# Patient Record
Sex: Female | Born: 1995 | Race: Asian
Health system: Southern US, Academic
[De-identification: ages and names within clinical notes are randomized; demographics above are authoritative.]

## PROBLEM LIST (undated history)

## (undated) ENCOUNTER — Encounter

## (undated) ENCOUNTER — Other Ambulatory Visit

## (undated) ENCOUNTER — Inpatient Hospital Stay

## (undated) DIAGNOSIS — Z789 Other specified health status: Secondary | ICD-10-CM

## (undated) DIAGNOSIS — D573 Sickle-cell trait: Secondary | ICD-10-CM

## (undated) HISTORY — PX: NO PAST SURGERIES: SHX2092

## (undated) HISTORY — PX: WISDOM TOOTH EXTRACTION: SHX21

---

## 2017-01-17 ENCOUNTER — Inpatient Hospital Stay: Admit: 2017-01-17 | Discharge: 2017-01-18

## 2017-01-17 DIAGNOSIS — Z538 Procedure and treatment not carried out for other reasons: Principal | ICD-10-CM
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
## 2017-01-18 NOTE — ED Notes
Pt is Not Answering...

## 2017-07-01 ENCOUNTER — Inpatient Hospital Stay: Admit: 2017-07-01 | Discharge: 2017-07-01

## 2017-07-01 ENCOUNTER — Emergency Department: Admit: 2017-07-01 | Discharge: 2017-07-01

## 2017-07-01 DIAGNOSIS — S0990XA Unspecified injury of head, initial encounter: Secondary | ICD-10-CM

## 2017-07-01 DIAGNOSIS — Z91018 Allergy to other foods: Secondary | ICD-10-CM

## 2017-07-01 DIAGNOSIS — H539 Unspecified visual disturbance: Secondary | ICD-10-CM

## 2017-07-01 DIAGNOSIS — F0781 Postconcussional syndrome: Principal | ICD-10-CM

## 2017-07-01 DIAGNOSIS — M79642 Pain in left hand: Secondary | ICD-10-CM

## 2017-07-01 DIAGNOSIS — Z833 Family history of diabetes mellitus: Secondary | ICD-10-CM

## 2017-07-01 DIAGNOSIS — S8012XA Contusion of left lower leg, initial encounter: Secondary | ICD-10-CM

## 2017-07-01 DIAGNOSIS — M549 Dorsalgia, unspecified: Secondary | ICD-10-CM

## 2017-07-01 DIAGNOSIS — R42 Dizziness and giddiness: Secondary | ICD-10-CM

## 2017-07-01 DIAGNOSIS — G44309 Post-traumatic headache, unspecified, not intractable: Secondary | ICD-10-CM

## 2017-07-01 MED ORDER — IBUPROFEN 800 MG PO TABS
800 mg | Freq: Once | ORAL | Status: CP
Start: 2017-07-01 — End: ?

## 2017-07-01 MED ORDER — METHYLDOPA 250 MG PO TABS
Freq: Three times a day (TID) | ORAL
Start: 2017-07-01 — End: 2017-07-01

## 2017-07-01 MED ORDER — ACETAMINOPHEN 325 MG PO TABS
650 mg | Freq: Once | ORAL | Status: CP
Start: 2017-07-01 — End: ?

## 2017-07-01 NOTE — ED Attestation Note
Attestation   I discussed this patient with the ARNP/PA.              Bennie Pieriniios, Luis E, MD  07/01/17 [phone]
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
## 2017-07-01 NOTE — ED Notes
Time of discharge: 1639  PM., Patient discharged to  Home.  Patient discharged  ambulatory. to exit with belongings in  Stable condition.  Patient escorted by  family., Written discharge instructions, education and follow up care given to  patient.  Patient/recipient  verbalizes discharge instructions.

## 2017-07-04 ENCOUNTER — Inpatient Hospital Stay: Admit: 2017-07-04 | Discharge: 2017-07-04

## 2017-07-04 DIAGNOSIS — Z91018 Allergy to other foods: Secondary | ICD-10-CM

## 2017-07-04 DIAGNOSIS — Z202 Contact with and (suspected) exposure to infections with a predominantly sexual mode of transmission: Principal | ICD-10-CM

## 2017-07-04 DIAGNOSIS — Z833 Family history of diabetes mellitus: Secondary | ICD-10-CM

## 2017-07-04 MED ORDER — AZITHROMYCIN 250 MG PO TABS
1000 mg | Freq: Once | ORAL | Status: CP
Start: 2017-07-04 — End: ?

## 2017-07-04 MED ORDER — CEFTRIAXONE SODIUM 250 MG IJ SOLR
250 mg | Freq: Once | INTRAMUSCULAR | Status: CP
Start: 2017-07-04 — End: ?
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
## 2017-07-04 NOTE — ED Provider Notes
EKG (Read by ED Provider):  {EKG findings:[phone]}        ED Course & Re-Evaluation          MDM   Decide to obtain history from someone other than the patient: {SH ED Lamonte SakaiJX MDM - OBTAIN ZOXWRUE:45409}HISTORY:28378}    Decide to obtain previous medical records: [facility]{SH ED Lamonte SakaiJX MDM - PREVIOUS MED REC - NO WJX:91478}YES:28380}    Clinical Lab Test(s): {SH ED Lamonte SakaiJX MDM ORDERED AND REVIEWED:28124}    Diagnostic Tests (Radiology, EKG): {SH ED Lamonte SakaiJX MDM ORDERED AND REVIEWED:28124}    Independent Visualization (ED US, Wet Prep, Other): {SH ED Lamonte SakaiJX MDM NO YES GNFAOZHY:86578}WILDCARD:26444}    Discussed patient with NON-ED Provider: {SH ED Lamonte SakaiJX MDM - ANOTHER PROVIDER:28381}      ED Disposition   ED Disposition: No ED Disposition Set      ED Clinical Impression   ED Clinical Impression:   No Clinical Impression Set      ED Patient Status   Patient Status:   [facility][phone]}        ED Medical Evaluation Initiated   Medical Evaluation Initiated:  Yes, filed at 07/01/17 1102  by Kennis CarinaArmstrong, Jamie L, PA-C
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
## 2017-07-04 NOTE — ED Provider Notes
?   Number of children: N/A   ? Years of education: N/A     Social History Main Topics   ? Smoking status: Never Smoker   ? Smokeless tobacco: Never Used   ? Alcohol use No   ? Drug use: No   ? Sexual activity: Not Asked     Other Topics Concern   ? None     Social History Narrative   ? None       Review of Systems   Constitutional: Negative for fever, chills, diaphoresis, appetite change, decreased responsiveness and fatigue.   HENT: Positive for facial swelling. Negative for nosebleeds, nasal congestion, drooling, trouble swallowing, neck pain, neck stiffness, voice change and ear discharge.    Eyes: Positive for visual disturbance.   Respiratory: Negative for cough, chest tightness, shortness of breath, wheezing and stridor.    Cardiovascular: Negative for chest pain, palpitations and leg swelling.   Gastrointestinal: Negative for nausea, vomiting, abdominal pain and bowel incontinence.   Genitourinary: Negative for bladder incontinence.   Musculoskeletal: Positive for back pain and arthralgias.   Skin: Positive for color change and wound.   Neurological: Positive for dizziness, light-headedness and headaches. Negative for tingling, focal weakness, seizures, loss of consciousness, syncope, facial asymmetry, speech difficulty, weakness and numbness.   Psychiatric/Behavioral: Negative for memory loss.   All other systems reviewed and are negative.      Physical Exam     ED Triage Vitals [07/01/17 0958]   BP 118/54   Pulse 104   Resp 18   Temp 36.9 ?C (98.5 ?F)   Temp src Oral   Height 1.651 m   Weight 78.9 kg   SpO2 99 %   BMI (Calculated) 29.02             Physical Exam   Constitutional: She is oriented to person, place, and time. She appears well-developed and well-nourished. No distress.   HENT:   Head: Normocephalic and atraumatic.   Neck: Normal range of motion. Neck supple.   Cardiovascular: Normal rate.    Pulmonary/Chest: Effort normal and breath sounds normal. No stridor. No
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
## 2017-07-04 NOTE — ED Notes
Time of discharge: 1855  PM., Patient discharged to  Home.  Patient discharged  ambulatory. to exit with belongings in  Stable condition.  Patient escorted by  no one., Written discharge instructions given to  patient.  Patient/recipient  verbalizes discharge instructions.

## 2017-07-04 NOTE — ED Provider Notes
History reviewed. No pertinent past medical history.    History reviewed. No pertinent surgical history.    Family History   Problem Relation Age of Onset   ? Diabetes Paternal Grandmother    ? Heart Failure Paternal Grandmother        Social History     Social History   ? Marital status: Single     Spouse name: N/A   ? Number of children: N/A   ? Years of education: N/A     Social History Main Topics   ? Smoking status: Never Smoker   ? Smokeless tobacco: Never Used   ? Alcohol use No   ? Drug use: No   ? Sexual activity: Not Asked     Other Topics Concern   ? None     Social History Narrative   ? None       Review of Systems   Constitutional: Negative for fever, chills, diaphoresis, appetite change, decreased responsiveness and fatigue.   HENT: Positive for facial swelling. Negative for nosebleeds, nasal congestion, drooling, trouble swallowing, neck pain, neck stiffness, voice change and ear discharge.    Eyes: Positive for visual disturbance.   Respiratory: Negative for cough, chest tightness, shortness of breath, wheezing and stridor.    Cardiovascular: Negative for chest pain, palpitations and leg swelling.   Gastrointestinal: Negative for nausea, vomiting, abdominal pain and bowel incontinence.   Genitourinary: Negative for bladder incontinence.   Musculoskeletal: Positive for back pain and arthralgias.   Skin: Positive for color change and wound.   Neurological: Positive for dizziness, light-headedness and headaches. Negative for tingling, focal weakness, seizures, loss of consciousness, syncope, facial asymmetry, speech difficulty, weakness and numbness.   Psychiatric/Behavioral: Negative for memory loss.   All other systems reviewed and are negative.      Physical Exam     ED Triage Vitals [07/01/17 0958]   BP 118/54   Pulse 104   Resp 18   Temp 36.9 ?C (98.5 ?F)   Temp src Oral   Height 1.651 m   Weight 78.9 kg   SpO2 99 %   BMI (Calculated) 29.02             Physical Exam

## 2017-07-04 NOTE — ED Provider Notes
No medications on file   Modified Medications    No medications on file   Discontinued Medications    METHYLDOPA (ALDOMET) 250 MG PO TABLET    Take by mouth 3 times daily.       History reviewed. No pertinent past medical history.    History reviewed. No pertinent surgical history.    Family History   Problem Relation Age of Onset   ? Diabetes Paternal Grandmother    ? Heart Failure Paternal Grandmother        Social History     Social History   ? Marital status: Single     Spouse name: N/A   ? Number of children: N/A   ? Years of education: N/A     Social History Main Topics   ? Smoking status: Never Smoker   ? Smokeless tobacco: Never Used   ? Alcohol use No   ? Drug use: No   ? Sexual activity: Not Asked     Other Topics Concern   ? None     Social History Narrative   ? None       Review of Systems   Constitutional: Negative for fever, chills, diaphoresis, appetite change, decreased responsiveness and fatigue.   HENT: Positive for facial swelling. Negative for nosebleeds, nasal congestion, drooling, trouble swallowing, neck pain, neck stiffness, voice change and ear discharge.    Eyes: Positive for visual disturbance.   Respiratory: Negative for cough, chest tightness, shortness of breath, wheezing and stridor.    Cardiovascular: Negative for chest pain, palpitations and leg swelling.   Gastrointestinal: Negative for nausea, vomiting, abdominal pain and bowel incontinence.   Genitourinary: Negative for bladder incontinence.   Musculoskeletal: Positive for back pain and arthralgias.   Skin: Positive for color change and wound.   Neurological: Positive for dizziness, light-headedness and headaches. Negative for tingling, focal weakness, seizures, loss of consciousness, syncope, facial asymmetry, speech difficulty, weakness and numbness.   Psychiatric/Behavioral: Negative for memory loss.   All other systems reviewed and are negative.      Physical Exam     ED Triage Vitals [07/01/17 0958]   BP 118/54   Pulse 104
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
## 2017-07-07 NOTE — ED Provider Notes
Gastrointestinal: Negative for nausea, vomiting, abdominal pain, diarrhea, constipation and anorexia.   Genitourinary: Negative for dysuria, frequency, hematuria, flank pain, vaginal bleeding, vaginal discharge, genital sores, vaginal pain, pelvic pain and dyspareunia.   Musculoskeletal: Negative for myalgias and arthralgias.   Skin: Negative for rash and wound.   Neurological: Negative for dizziness and light-headedness.   Psychiatric/Behavioral: Negative for substance abuse.       Physical Exam     ED Triage Vitals [07/04/17 1357]   BP 107/53   Pulse 73   Resp 14   Temp 36.5 ?C (97.7 ?F)   Temp src Oral   Height 1.651 m   Weight 72.6 kg   SpO2 99 %   BMI (Calculated) 26.68             Physical Exam   Constitutional: She is oriented to person, place, and time. She appears well-developed and well-nourished. No distress.   HENT:   Head: Normocephalic and atraumatic.   Mouth/Throat: Oropharynx is clear and moist.   Eyes: Pupils are equal, round, and reactive to light. Conjunctivae are normal.   Cardiovascular: Normal rate, regular rhythm, normal heart sounds and intact distal pulses.    Pulmonary/Chest: Effort normal and breath sounds normal.   Abdominal: Soft. There is no tenderness. Hernia confirmed negative in the right inguinal area and confirmed negative in the left inguinal area.   Genitourinary: There is no rash, tenderness or lesion on the right labia. There is no rash, tenderness or lesion on the left labia. Uterus is not tender. Cervix exhibits no discharge. Right adnexum displays no tenderness. Left adnexum displays no tenderness. No erythema, tenderness or bleeding in the vagina. No foreign body in the vagina. No signs of injury around the vagina. No vaginal discharge found.   Genitourinary Comments: RN Laural BenesJohnson present throughout exam     Musculoskeletal: She exhibits no edema or tenderness.   Extremies well perfused   Lymphadenopathy:        Right: No inguinal adenopathy present.

## 2017-07-07 NOTE — ED Provider Notes
Clarity, UA Clear      Spec Grav 1.005  1.003 - 1.030    pH 6.0 (*) 7.4 - 7.4    Urobilinogen -Ur 0.2  <=2.0 E.U./dL    Nitrite -Ur Negative  Negative    Protein-UA Negative  Negative mg/dL    Glucose -Ur Negative  Negative mg/dL    Blood, UA Negative  Negative    WBC, UA Trace (*) Negative    Bilirubin -Ur Negative  Negative    Ketones -UR Negative  Negative   POCT URINE PREGNANCY - Normal    Preg Test, Urine (POC) Negative  Negative   CHLAMYDIA/ GONORRHEA DETECTIO*   POCT URINE PREGNANCY   POCT URINALYSIS AUTO W/O MICROSCOPY         Imaging (Read by ED Provider):  not applicable      EKG (Read by ED Provider):  not applicable        ED Course & Re-Evaluation      ***    MDM   Decide to obtain history from someone other than the patient: {SH ED Lamonte SakaiJX MDM - OBTAIN ZOXWRUE:45409}HISTORY:28378}    Decide to obtain previous medical records: [facility]{SH ED Lamonte SakaiJX MDM - PREVIOUS MED REC - NO WJX:91478}YES:28380}    Clinical Lab Test(s): {SH ED Lamonte SakaiJX MDM ORDERED AND REVIEWED:28124}    Diagnostic Tests (Radiology, EKG): {SH ED Lamonte SakaiJX MDM ORDERED AND REVIEWED:28124}    Independent Visualization (ED US, Wet Prep, Other): {SH ED Lamonte SakaiJX MDM NO YES GNFAOZHY:86578}WILDCARD:26444}    Discussed patient with NON-ED Provider: {SH ED Lamonte SakaiJX MDM - ANOTHER PROVIDER:28381}      ED Disposition   ED Disposition: No ED Disposition Set      ED Clinical Impression   ED Clinical Impression:   No Clinical Impression Set      ED Patient Status   Patient Status:   [phone]}        ED Medical Evaluation Initiated   Medical Evaluation Initiated:  Yes, filed at 07/04/17 1704  by Dishong, Harlon DittyWilliam Patrick, MD            Dishong, Harlon DittyWilliam Patrick, MD  07/04/17 1704       Dishong, Harlon DittyWilliam Patrick, MD  07/04/17 1713       Dishong, Harlon DittyWilliam Patrick, MD  07/04/17 1829

## 2017-07-07 NOTE — ED Provider Notes
Clarity, UA Clear      Spec Grav 1.005  1.003 - 1.030    pH 6.0 (*) 7.4 - 7.4    Urobilinogen -Ur 0.2  <=2.0 E.U./dL    Nitrite -Ur Negative  Negative    Protein-UA Negative  Negative mg/dL    Glucose -Ur Negative  Negative mg/dL    Blood, UA Negative  Negative    WBC, UA Trace (*) Negative    Bilirubin -Ur Negative  Negative    Ketones -UR Negative  Negative   POCT URINE PREGNANCY - Normal    Preg Test, Urine (POC) Negative  Negative   CHLAMYDIA/ GONORRHEA DETECTIO*   POCT URINE PREGNANCY   POCT URINALYSIS AUTO W/O MICROSCOPY         Imaging (Read by ED Provider):  not applicable      EKG (Read by ED Provider):  not applicable        ED Course & Re-Evaluation      ***    MDM   Decide to obtain history from someone other than the patient: {SH ED Lamonte SakaiJX MDM - OBTAIN ZOXWRUE:45409}HISTORY:28378}    Decide to obtain previous medical records: [facility]{SH ED Lamonte SakaiJX MDM - PREVIOUS MED REC - NO WJX:91478}YES:28380}    Clinical Lab Test(s): {SH ED Lamonte SakaiJX MDM ORDERED AND REVIEWED:28124}    Diagnostic Tests (Radiology, EKG): {SH ED Lamonte SakaiJX MDM ORDERED AND REVIEWED:28124}    Independent Visualization (ED US, Wet Prep, Other): {SH ED Lamonte SakaiJX MDM NO YES GNFAOZHY:86578}WILDCARD:26444}    Discussed patient with NON-ED Provider: {SH ED Lamonte SakaiJX MDM - ANOTHER PROVIDER:28381}      ED Disposition   ED Disposition: No ED Disposition Set      ED Clinical Impression   ED Clinical Impression:   No Clinical Impression Set      ED Patient Status   Patient Status:   {[phone]}        ED Medical Evaluation Initiated   Medical Evaluation Initiated:  Yes, filed at 07/04/17 1704  by Dishong, Harlon DittyWilliam Patrick, MD            Dishong, Harlon DittyWilliam Patrick, MD  07/04/17 1704       Dishong, Harlon DittyWilliam Patrick, MD  07/04/17 1713

## 2017-07-07 NOTE — ED Provider Notes
{  Imaging findings:[phone]}      EKG (Read by ED Provider):  {EKG [phone]}        ED Course & Re-Evaluation          MDM   Decide to obtain history from someone other than the patient: {SH ED Lamonte SakaiJX MDM - OBTAIN ZOXWRUE:45409}HISTORY:28378}    Decide to obtain previous medical records: [facility]{SH ED Lamonte SakaiJX MDM - PREVIOUS MED REC - NO WJX:91478}YES:28380}    Clinical Lab Test(s): {SH ED Lamonte SakaiJX MDM ORDERED AND REVIEWED:28124}    Diagnostic Tests (Radiology, EKG): {SH ED Lamonte SakaiJX MDM ORDERED AND REVIEWED:28124}    Independent Visualization (ED US, Wet Prep, Other): {SH ED Lamonte SakaiJX MDM NO YES GNFAOZHY:86578}WILDCARD:26444}    Discussed patient with NON-ED Provider: {SH ED Lamonte SakaiJX MDM - ANOTHER PROVIDER:28381}      ED Disposition   ED Disposition: No ED Disposition Set      ED Clinical Impression   ED Clinical Impression:   No Clinical Impression Set      ED Patient Status   Patient Status:   [facility]:[phone]}        ED Medical Evaluation Initiated   Medical Evaluation Initiated:  Yes, filed at 07/04/17 1704  by Dishong, Harlon DittyWilliam Patrick, MD            Dishong, Harlon DittyWilliam Patrick, MD  07/04/17 1704

## 2017-07-07 NOTE — ED Provider Notes
Gastrointestinal: Negative for nausea, vomiting, abdominal pain, diarrhea, constipation and anorexia.   Genitourinary: Negative for dysuria, frequency, hematuria, flank pain, vaginal bleeding, vaginal discharge, genital sores, vaginal pain, pelvic pain and dyspareunia.   Musculoskeletal: Negative for myalgias and arthralgias.   Skin: Negative for rash and wound.   Neurological: Negative for dizziness and light-headedness.   Psychiatric/Behavioral: Negative for substance abuse.       Physical Exam     ED Triage Vitals [07/04/17 1357]   BP 107/53   Pulse 73   Resp 14   Temp 36.5 ?C (97.7 ?F)   Temp src Oral   Height 1.651 m   Weight 72.6 kg   SpO2 99 %   BMI (Calculated) 26.68             Physical Exam   Constitutional: She is oriented to person, place, and time. She appears well-developed and well-nourished. No distress.   HENT:   Head: Normocephalic and atraumatic.   Mouth/Throat: Oropharynx is clear and moist.   Eyes: Pupils are equal, round, and reactive to light. Conjunctivae are normal.   Cardiovascular: Normal rate, regular rhythm, normal heart sounds and intact distal pulses.    Pulmonary/Chest: Effort normal and breath sounds normal.   Abdominal: Soft. There is no tenderness.   Genitourinary:   Genitourinary Comments: RN *** present throughout exam   Musculoskeletal: She exhibits no edema or tenderness.   Extremies well perfused   Neurological: She is alert and oriented to person, place, and time.   Skin: Skin is warm and dry. No rash noted. She is not diaphoretic.   Psychiatric: She has a normal mood and affect.   Linear thought process   Nursing note and vitals reviewed.      Differential DDx: pregnancy, UTI, STI/exposure to STI    Is this an Emergent Medical Condition? Yes - Severe Pain/Acute Onset of Symptons  409.901 FS  641.19 FS  627.732 (16) FS    ED Workup   Procedures    Labs:  -   POCT URINALYSIS AUTO W/O MICROSCOPY - Abnormal        Result Value Ref Range    Color -Ur Yellow

## 2017-07-07 NOTE — ED Provider Notes
History     Chief Complaint   Patient presents with   ? STD Female       HPI    Allergies   Allergen Reactions   ? Orange Fruit [Citrus] Rash       Patient's Medications    No medications on file       History reviewed. No pertinent past medical history.    History reviewed. No pertinent surgical history.    Family History   Problem Relation Age of Onset   ? Diabetes Paternal Grandmother    ? Heart Failure Paternal Grandmother        Social History     Social History   ? Marital status: Single     Spouse name: N/A   ? Number of children: N/A   ? Years of education: N/A     Social History Main Topics   ? Smoking status: Never Smoker   ? Smokeless tobacco: Never Used   ? Alcohol use No   ? Drug use: No   ? Sexual activity: Not Asked     Other Topics Concern   ? None     Social History Narrative   ? None       Review of Systems    Physical Exam     ED Triage Vitals [07/04/17 1357]   BP 107/53   Pulse 73   Resp 14   Temp 36.5 ?C (97.7 ?F)   Temp src Oral   Height 1.651 m   Weight 72.6 kg   SpO2 99 %   BMI (Calculated) 26.68             Physical Exam    Differential DDx: ***    Is this an Emergent Medical Condition? {SH ED EMERGENT MEDICAL CONDITION:[phone]}  409.901 FS  641.19 FS  627.732 (16) FS    ED Workup   Procedures    Labs:  -   POCT URINALYSIS AUTO W/O MICROSCOPY - Abnormal        Result Value Ref Range    Color -Ur Yellow      Clarity, UA Clear      Spec Grav 1.005  1.003 - 1.030    pH 6.0 (*) 7.4 - 7.4    Urobilinogen -Ur 0.2  <=2.0 E.U./dL    Nitrite -Ur Negative  Negative    Protein-UA Negative  Negative mg/dL    Glucose -Ur Negative  Negative mg/dL    Blood, UA Negative  Negative    WBC, UA Trace (*) Negative    Bilirubin -Ur Negative  Negative    Ketones -UR Negative  Negative   POCT URINE PREGNANCY - Normal    Preg Test, Urine (POC) Negative  Negative   CHLAMYDIA/ GONORRHEA DETECTIO*   POCT URINE PREGNANCY   POCT URINALYSIS AUTO W/O MICROSCOPY         Imaging (Read by ED Provider):

## 2017-07-07 NOTE — ED Provider Notes
History     Chief Complaint   Patient presents with   ? STD Female       21 yo female who presents to the ED with concern for STI exposure.  Pt reports her significant tested positive for chlamydia.  Pt denies prior STI.  Pt denies vaginal discharge, dysuria, rash, bleeding, abdominal/pelvic/back pain. No f/c. No hematuria.      The history is provided by the patient.   STD Female   Primary symptoms include no discharge, no pelvic pain, no dyspareunia, no genital lesions, no genital pain, no genital rash, no genital itching, no genital odor, no dysuria, and no vaginal bleeding. Primary symptoms comment: Exposure to STI. There has been no fever. This is a new problem. She is not pregnant. She has not missed her period. Pertinent negatives include no anorexia, no diaphoresis, no abdominal swelling, no abdominal pain, no constipation, no diarrhea, no nausea, no vomiting, no frequency, no light-headedness and no dizziness. She has tried nothing for the symptoms.       Allergies   Allergen Reactions   ? Orange Fruit [Citrus] Rash       Patient's Medications    No medications on file       History reviewed. No pertinent past medical history.    History reviewed. No pertinent surgical history.    Family History   Problem Relation Age of Onset   ? Diabetes Paternal Grandmother    ? Heart Failure Paternal Grandmother        Social History     Social History   ? Marital status: Single     Spouse name: N/A   ? Number of children: N/A   ? Years of education: N/A     Social History Main Topics   ? Smoking status: Never Smoker   ? Smokeless tobacco: Never Used   ? Alcohol use No   ? Drug use: No   ? Sexual activity: Not Asked     Other Topics Concern   ? None     Social History Narrative   ? None       Review of Systems   Constitutional: Negative for fever, chills, diaphoresis and fatigue.   HENT: Negative for sore throat.    Eyes: Negative for discharge.   Respiratory: Negative for cough.

## 2017-07-07 NOTE — ED Provider Notes
Left: No inguinal adenopathy present.   Neurological: She is alert and oriented to person, place, and time.   Skin: Skin is warm and dry. No rash noted. She is not diaphoretic.   Psychiatric: She has a normal mood and affect.   Linear thought process   Nursing note and vitals reviewed.      Differential DDx: pregnancy, UTI, STI/exposure to STI    Is this an Emergent Medical Condition? Yes - Severe Pain/Acute Onset of Symptons  409.901 FS  641.19 FS  627.732 (16) FS    ED Workup   Procedures    Labs:  -   POCT URINALYSIS AUTO W/O MICROSCOPY - Abnormal        Result Value Ref Range    Color -Ur Yellow      Clarity, UA Clear      Spec Grav 1.005  1.003 - 1.030    pH 6.0 (*) 7.4 - 7.4    Urobilinogen -Ur 0.2  <=2.0 E.U./dL    Nitrite -Ur Negative  Negative    Protein-UA Negative  Negative mg/dL    Glucose -Ur Negative  Negative mg/dL    Blood, UA Negative  Negative    WBC, UA Trace (*) Negative    Bilirubin -Ur Negative  Negative    Ketones -UR Negative  Negative   POCT URINE PREGNANCY - Normal    Preg Test, Urine (POC) Negative  Negative   CHLAMYDIA/ GONORRHEA DETECTIO*   POCT URINE PREGNANCY   POCT URINALYSIS AUTO W/O MICROSCOPY         Imaging (Read by ED Provider):  not applicable      EKG (Read by ED Provider):  not applicable        ED Course & Re-Evaluation      STI exposure.   Non-toxic. No evidence of PID.  Not pregnant. Wet prep negative. Will give STI PPx.   Answered questions to the best of my ability.  Pt aware of precautions for return.  Pt comfortable with plan.      MDM   Decide to obtain history from someone other than the patient: No    Decide to obtain previous medical records: Yes - The review of the records selected below is documented in the ED Note : ( Prior ED visit )    Clinical Lab Test(s): Ordered and Reviewed    Diagnostic Tests (Radiology, EKG): N/A    Independent Visualization (ED US, Wet Prep, Other): Yes - Documented in ED Provider Note

## 2017-07-07 NOTE — ED Provider Notes
Discussed patient with NON-ED Provider: None      ED Disposition   ED Disposition: No ED Disposition Set      ED Clinical Impression   ED Clinical Impression:   No Clinical Impression Set      ED Patient Status   Patient Status:   Good        ED Medical Evaluation Initiated   Medical Evaluation Initiated:  Yes, filed at 07/04/17 1704  by Dishong, Harlon DittyWilliam Patrick, MD            Dishong, Harlon DittyWilliam Patrick, MD  07/04/17 1704       Dishong, Harlon DittyWilliam Patrick, MD  07/04/17 1713       Dishong, Harlon DittyWilliam Patrick, MD  07/04/17 1829       Dishong, Harlon DittyWilliam Patrick, MD  07/06/17 2025

## 2018-05-14 ENCOUNTER — Other Ambulatory Visit: Payer: Self-pay

## 2018-05-14 ENCOUNTER — Emergency Department (HOSPITAL_COMMUNITY): Payer: Self-pay

## 2018-05-14 ENCOUNTER — Encounter (HOSPITAL_COMMUNITY): Payer: Self-pay | Admitting: Emergency Medicine

## 2018-05-14 ENCOUNTER — Emergency Department (HOSPITAL_COMMUNITY)
Admission: EM | Admit: 2018-05-14 | Discharge: 2018-05-14 | Disposition: A | Discharge status: Home / Self Care | Payer: Self-pay | Attending: Emergency Medicine | Admitting: Emergency Medicine

## 2018-05-14 DIAGNOSIS — O468X1 Other antepartum hemorrhage, first trimester: Secondary | ICD-10-CM | POA: Insufficient documentation

## 2018-05-14 DIAGNOSIS — O418X11 Other specified disorders of amniotic fluid and membranes, first trimester, fetus 1: Secondary | ICD-10-CM

## 2018-05-14 DIAGNOSIS — M545 Low back pain: Secondary | ICD-10-CM | POA: Insufficient documentation

## 2018-05-14 DIAGNOSIS — Z3A01 Less than 8 weeks gestation of pregnancy: Secondary | ICD-10-CM | POA: Insufficient documentation

## 2018-05-14 DIAGNOSIS — O26891 Other specified pregnancy related conditions, first trimester: Secondary | ICD-10-CM | POA: Insufficient documentation

## 2018-05-14 DIAGNOSIS — Z3491 Encounter for supervision of normal pregnancy, unspecified, first trimester: Secondary | ICD-10-CM | POA: Insufficient documentation

## 2018-05-14 DIAGNOSIS — Z79899 Other long term (current) drug therapy: Secondary | ICD-10-CM | POA: Insufficient documentation

## 2018-05-14 LAB — URINALYSIS, ROUTINE W REFLEX MICROSCOPIC
Bacteria, UA: NONE SEEN
Bilirubin Urine: NEGATIVE
GLUCOSE, UA: NEGATIVE mg/dL
Hgb urine dipstick: NEGATIVE
Ketones, ur: NEGATIVE mg/dL
NITRITE: NEGATIVE
PH: 5 (ref 5.0–8.0)
PROTEIN: NEGATIVE mg/dL
Specific Gravity, Urine: 1.014 (ref 1.005–1.030)

## 2018-05-14 LAB — CBC
HCT: 34.8 % — ABNORMAL LOW (ref 36.0–46.0)
HEMOGLOBIN: 10.9 g/dL — AB (ref 12.0–15.0)
MCH: 23.9 pg — AB (ref 26.0–34.0)
MCHC: 31.3 g/dL (ref 30.0–36.0)
MCV: 76.1 fL — ABNORMAL LOW (ref 78.0–100.0)
Platelets: 286 10*3/uL (ref 150–400)
RBC: 4.57 MIL/uL (ref 3.87–5.11)
RDW: 14.3 % (ref 11.5–15.5)
WBC: 5.1 10*3/uL (ref 4.0–10.5)

## 2018-05-14 LAB — COMPREHENSIVE METABOLIC PANEL
ALK PHOS: 30 U/L — AB (ref 38–126)
ALT: 14 U/L (ref 14–54)
ANION GAP: 7 (ref 5–15)
AST: 20 U/L (ref 15–41)
Albumin: 3.9 g/dL (ref 3.5–5.0)
BILIRUBIN TOTAL: 0.6 mg/dL (ref 0.3–1.2)
BUN: <5 mg/dL — ABNORMAL LOW (ref 6–20)
CALCIUM: 9.3 mg/dL (ref 8.9–10.3)
CO2: 24 mmol/L (ref 22–32)
CREATININE: 0.69 mg/dL (ref 0.44–1.00)
Chloride: 106 mmol/L (ref 101–111)
GFR calc non Af Amer: >60 mL/min (ref >60)
GLUCOSE: 98 mg/dL (ref 65–99)
Potassium: 3.5 mmol/L (ref 3.5–5.1)
Sodium: 137 mmol/L (ref 135–145)
TOTAL PROTEIN: 7.5 g/dL (ref 6.5–8.1)

## 2018-05-14 LAB — WET PREP, GENITAL
CLUE CELLS WET PREP: NONE SEEN
SPERM: NONE SEEN
TRICH WET PREP: NONE SEEN
YEAST WET PREP: NONE SEEN

## 2018-05-14 LAB — I-STAT BETA HCG BLOOD, ED (MC, WL, AP ONLY): I-stat hCG, quantitative: >2000 m[IU]/mL — ABNORMAL HIGH (ref <5)

## 2018-05-14 LAB — LIPASE, BLOOD: Lipase: 23 U/L (ref 11–51)

## 2018-05-14 LAB — HCG, QUANTITATIVE, PREGNANCY: HCG, BETA CHAIN, QUANT, S: 136448 m[IU]/mL — AB (ref <5)

## 2018-05-14 MED ORDER — CONCEPT OB 130-92.4-1 MG PO CAPS: 1.0000 | ORAL_CAPSULE | Freq: Every day | ORAL | 12 refills | Status: AC

## 2018-05-14 NOTE — ED Provider Notes (Signed)
[facility] EMERGENCY DEPARTMENT Provider Note   CSN: [id number] Arrival date & time: 05/14/18  4098     History   Chief Complaint Chief Complaint  Patient presents with  . Headache    HPI Melissa Gay is a 22 y.o. female who presents for cc of lower back and abdominal pain. She c/o pain in her lower back which is worse with movement, standing and twisting. She says that it is pressure like. She denies previous injuries. She feels that it is associated with mild suprapubic abdominal pain which she feels also began 3 weeks ago. She denies vaginal or urinary sxs. She denies constipation, nausea, or vomiting. She has had intermittent temporal headaches that she describes as fleeting, stabbing, and throbbing. Denies photophobia, phonophobia, UL throbbing, N/V, visual changes, stiff neck, neck pain, rash, or "thunderclap" onset.    HPI  History reviewed. No pertinent past medical history.  There are no active problems to display for this patient.   History reviewed. No pertinent surgical history.   OB History   None      Home Medications    Prior to Admission medications   Medication Sig Start Date End Date Taking? Authorizing Provider  Prenat w/o A Vit-FeFum-FePo-FA (CONCEPT OB) 130-92.4-1 MG CAPS Take 1 capsule by mouth daily. 05/14/18   Arthor Captain, PA-C    Family History No family history on file.  Social History Social History   Tobacco Use  . Smoking status: Not on file  Substance Use Topics  . Alcohol use: Not on file  . Drug use: Not on file     Allergies   Patient has no known allergies.   Review of Systems Review of Systems Ten systems reviewed and are negative for acute change, except as noted in the HPI.    Physical Exam Updated Vital Signs BP 124/72 (BP Location: Right Arm)   Pulse 72   Temp 98.2 F (36.8 C) (Oral)   Resp 20   SpO2 99%   Physical Exam  Constitutional: She is oriented to person, place, and  time. She appears well-developed and well-nourished. No distress.  HENT:  Head: Normocephalic and atraumatic.  Mouth/Throat: Oropharynx is clear and moist.  Eyes: Pupils are equal, round, and reactive to light. Conjunctivae and EOM are normal. No scleral icterus. Right eye exhibits normal extraocular motion and no nystagmus. Left eye exhibits normal extraocular motion and no nystagmus.  No horizontal, vertical or rotational nystagmus  Neck: Normal range of motion. Neck supple.  Full active and passive ROM without pain No midline or paraspinal tenderness No nuchal rigidity or meningeal signs  Cardiovascular: Normal rate, regular rhythm and intact distal pulses.  Pulmonary/Chest: Effort normal and breath sounds normal. No respiratory distress. She has no wheezes. She has no rales.  Abdominal: Soft. Bowel sounds are normal. She exhibits no distension. There is no tenderness. There is no rebound and no guarding.  Genitourinary:  Genitourinary Comments: Pelvic exam: normal external genitalia, vulva, vagina, cervix, uterus.  Mild tenderness in the left adnexa   Musculoskeletal: Normal range of motion.  Pain in th R lumbar region with twisting and flexion, also Palpation of the lumbar paraspinals  Lymphadenopathy:    She has no cervical adenopathy.  Neurological: She is alert and oriented to person, place, and time. She has normal strength. No cranial nerve deficit. She exhibits normal muscle tone. She displays a negative Romberg sign. Coordination normal. GCS eye subscore is 4. GCS verbal subscore is 5. GCS  motor subscore is 6.  Mental Status:  Alert, oriented, thought content appropriate. Speech fluent without evidence of aphasia. Able to follow 2 step commands without difficulty.  Cranial Nerves:  II:  Peripheral visual fields grossly normal, pupils equal, round, reactive to light III,IV, VI: ptosis not present, extra-ocular motions intact bilaterally  V,VII: smile symmetric, facial light touch  sensation equal VIII: hearing grossly normal bilaterally  IX,X: midline uvula rise  XI: bilateral shoulder shrug equal and strong XII: midline tongue extension  Motor:  5/5 in upper and lower extremities bilaterally including strong and equal grip strength and dorsiflexion/plantar flexion Sensory: Pinprick and light touch normal in all extremities.  Cerebellar: normal finger-to-nose with bilateral upper extremities Gait: normal gait and balance CV: distal pulses palpable throughout   Skin: Skin is warm and dry. No rash noted. She is not diaphoretic.  Psychiatric: She has a normal mood and affect. Her behavior is normal. Judgment and thought content normal.  Nursing note and vitals reviewed.    ED Treatments / Results  Labs (all labs ordered are listed, but only abnormal results are displayed) Labs Reviewed  WET PREP, GENITAL - Abnormal; Notable for the following components:      Result Value   WBC, Wet Prep HPF POC MANY (*)    All other components within normal limits  COMPREHENSIVE METABOLIC PANEL - Abnormal; Notable for the following components:   BUN <5 (*)    Alkaline Phosphatase 30 (*)    All other components within normal limits  CBC - Abnormal; Notable for the following components:   Hemoglobin 10.9 (*)    HCT 34.8 (*)    MCV 76.1 (*)    MCH 23.9 (*)    All other components within normal limits  URINALYSIS, ROUTINE W REFLEX MICROSCOPIC - Abnormal; Notable for the following components:   APPearance HAZY (*)    Leukocytes, UA SMALL (*)    All other components within normal limits  HCG, QUANTITATIVE, PREGNANCY - Abnormal; Notable for the following components:   hCG, Beta Chain, Quant, [place] [phone] (*)    All other components within normal limits  I-STAT BETA HCG BLOOD, ED ([facility], [facility], [facility] ONLY) - Abnormal; Notable for the following components:   I-stat hCG, quantitative >2,000.0 (*)    All other components within normal limits  LIPASE, BLOOD  GC/CHLAMYDIA PROBE AMP ([facility]) NOT AT [facility]    EKG None  Radiology [place] Ob Comp < 14 Wks  Result Date: 05/14/2018 CLINICAL DATA:  22 year old pregnant female with acute pelvic pain. Beta HCG less than 2000. EXAM: OBSTETRIC <14 WK US AND TRANSVAGINAL OB US TECHNIQUE: Both transabdominal and transvaginal ultrasound examinations were performed for complete evaluation of the gestation as well as the maternal uterus, adnexal regions, and pelvic cul-de-sac. Transvaginal technique was performed to assess early pregnancy. COMPARISON:  None. FINDINGS: Intrauterine gestational sac: Single Yolk sac:  Visualized. Embryo:  Visualized. Cardiac Activity: Visualized. Heart Rate: 133 bpm CRL:  10.8 mm   7 w   1 d                  US EDC: 12/30/2018 Subchorionic hemorrhage:  A small subchorionic hemorrhage is noted. Maternal uterus/adnexae: No significant abnormalities within the ovaries. A trace amount of free pelvic fluid is identified. IMPRESSION: 1. Single living intrauterine gestation with estimated gestational age of [redacted] weeks 1 day by this ultrasound. 2. Small subchorionic hemorrhage. Electronically Signed   By: Harmon PierJeffrey  Hu M.D.   On: 05/14/2018  15:08   US Ob Transvaginal  Result Date: 05/14/2018 CLINICAL DATA:  22 year old pregnant female with acute pelvic pain. Beta HCG less than 2000. EXAM: OBSTETRIC <14 WK [place] AND TRANSVAGINAL OB US TECHNIQUE: Both transabdominal and transvaginal ultrasound examinations were performed for complete evaluation of the gestation as well as the maternal uterus, adnexal regions, and pelvic cul-de-sac. Transvaginal technique was performed to assess early pregnancy. COMPARISON:  None. FINDINGS: Intrauterine gestational sac: Single Yolk sac:  Visualized. Embryo:  Visualized. Cardiac Activity: Visualized. Heart Rate: 133 bpm CRL:  10.8 mm   7 w   1 d                  [place] EDC: 12/30/2018 Subchorionic hemorrhage:  A small subchorionic hemorrhage is noted. Maternal uterus/adnexae: No significant abnormalities within the  ovaries. A trace amount of free pelvic fluid is identified. IMPRESSION: 1. Single living intrauterine gestation with estimated gestational age of [redacted] weeks 1 day by this ultrasound. 2. Small subchorionic hemorrhage. Electronically Signed   By: Harmon Pier M.D.   On: 05/14/2018 15:08    Procedures Procedures (including critical care time)  Medications Ordered in ED Medications - No data to display   Initial Impression / Assessment and Plan / ED Course  I have reviewed the triage vital signs and the nursing notes.  Pertinent labs & imaging results that were available during my care of the patient were reviewed by me and considered in my medical decision making (see chart for details).  Clinical Course as of May 14 1649  Mon May 14, 2018  1239 P screening prgenancy test. I have ordered a quant HCG.  I-stat hCG, quantitative(!): >2,000.0 [AH]    Clinical Course User Index [AH] Arthor Captain, PA-C       Patient's ultrasound reveals a 7-week 1 day IUP.  There is a very small subchorionic hemorrhage she does not have any active bleeding.  Her quantitative hCG correlates with her estimated date of gestation.  She is G2, P1.  She appears appropriate for discharge at this time with OB/GYN follow-up.  I discussed return precautions with the patient. Final Clinical Impressions(s) / ED Diagnoses   Final diagnoses:  Normal intrauterine pregnancy on prenatal ultrasound in first trimester  Subchorionic hemorrhage of placenta in first trimester, fetus 1 of multiple gestation    ED Discharge Orders        Ordered    Prenat w/o A Vit-FeFum-FePo-FA (CONCEPT OB) 130-92.4-1 MG CAPS  Daily     05/14/18 1607       Arthor Captain, PA-C 05/14/18 1650    Gerhard Munch, MD 05/15/18 [phone]

## 2018-05-14 NOTE — ED Notes (Signed)
Spoke with main lab in reference to Beta HCG Quant - states they have enough blood in lab to perform test

## 2018-05-14 NOTE — ED Triage Notes (Signed)
Patient complains of intermittent headaches, sharp lower back pain, and cramping abdominal pain x2 weeks. Alert, oriented and ambulatory and in no apparent distress at this time.

## 2018-05-14 NOTE — Discharge Instructions (Addendum)
If you have pain you may take tylenol as directed on the label Subchorionic hematoma is the most common abnormality found on a result from ultrasonography done during the first trimester or early second trimester of pregnancy. If there has been little or no vaginal bleeding, early small hematomas usually shrink on their own and do not affect your baby or pregnancy. The blood is gradually absorbed over 1-2 weeks.  Please go to the Maternal admissions unit (emergency) department at [facility] if you develop any worsening pain or bleeding.

## 2018-05-15 LAB — GC/CHLAMYDIA PROBE AMP (~~LOC~~) NOT AT ARMC
Chlamydia: POSITIVE — AB
Neisseria Gonorrhea: NEGATIVE

## 2018-05-28 ENCOUNTER — Emergency Department (HOSPITAL_COMMUNITY)
Admission: EM | Admit: 2018-05-28 | Discharge: 2018-05-28 | Disposition: A | Discharge status: Home / Self Care | Payer: Medicaid - Out of State | Attending: Emergency Medicine | Admitting: Emergency Medicine

## 2018-05-28 ENCOUNTER — Other Ambulatory Visit: Payer: Self-pay

## 2018-05-28 ENCOUNTER — Encounter (HOSPITAL_COMMUNITY): Payer: Self-pay

## 2018-05-28 DIAGNOSIS — N898 Other specified noninflammatory disorders of vagina: Secondary | ICD-10-CM

## 2018-05-28 LAB — CBC
HEMATOCRIT: 33.5 % — AB (ref 36.0–46.0)
HEMOGLOBIN: 10.5 g/dL — AB (ref 12.0–15.0)
MCH: 24 pg — ABNORMAL LOW (ref 26.0–34.0)
MCHC: 31.3 g/dL (ref 30.0–36.0)
MCV: 76.5 fL — ABNORMAL LOW (ref 78.0–100.0)
Platelets: 256 10*3/uL (ref 150–400)
RBC: 4.38 MIL/uL (ref 3.87–5.11)
RDW: 13.7 % (ref 11.5–15.5)
WBC: 6.4 10*3/uL (ref 4.0–10.5)

## 2018-05-28 LAB — COMPREHENSIVE METABOLIC PANEL
ALBUMIN: 3.8 g/dL (ref 3.5–5.0)
ALK PHOS: 29 U/L — AB (ref 38–126)
ALT: 23 U/L (ref 14–54)
ANION GAP: 9 (ref 5–15)
AST: 22 U/L (ref 15–41)
BILIRUBIN TOTAL: 0.2 mg/dL — AB (ref 0.3–1.2)
BUN: <5 mg/dL — ABNORMAL LOW (ref 6–20)
CALCIUM: 9.4 mg/dL (ref 8.9–10.3)
CO2: 22 mmol/L (ref 22–32)
Chloride: 105 mmol/L (ref 101–111)
Creatinine, Ser: 0.51 mg/dL (ref 0.44–1.00)
GFR calc non Af Amer: >60 mL/min (ref >60)
GLUCOSE: 94 mg/dL (ref 65–99)
POTASSIUM: 3.7 mmol/L (ref 3.5–5.1)
Sodium: 136 mmol/L (ref 135–145)
TOTAL PROTEIN: 7.3 g/dL (ref 6.5–8.1)

## 2018-05-28 LAB — WET PREP, GENITAL
Clue Cells Wet Prep HPF POC: NONE SEEN
Sperm: NONE SEEN
Trich, Wet Prep: NONE SEEN
Yeast Wet Prep HPF POC: NONE SEEN

## 2018-05-28 LAB — LIPASE, BLOOD: Lipase: 21 U/L (ref 11–51)

## 2018-05-28 LAB — URINALYSIS, ROUTINE W REFLEX MICROSCOPIC
BILIRUBIN URINE: NEGATIVE
GLUCOSE, UA: NEGATIVE mg/dL
HGB URINE DIPSTICK: NEGATIVE
KETONES UR: NEGATIVE mg/dL
NITRITE: NEGATIVE
PROTEIN: NEGATIVE mg/dL
Specific Gravity, Urine: 1.014 (ref 1.005–1.030)
pH: 5 (ref 5.0–8.0)

## 2018-05-28 LAB — HCG, QUANTITATIVE, PREGNANCY: HCG, BETA CHAIN, QUANT, S: 209799 m[IU]/mL — AB (ref <5)

## 2018-05-28 MED ORDER — LIDOCAINE HCL (PF) 1 % IJ SOLN
INTRAMUSCULAR | Status: AC
  Filled 2018-05-28: qty 5

## 2018-05-28 MED ORDER — CEFTRIAXONE SODIUM 250 MG IJ SOLR
250.0000 mg | Freq: Once | INTRAMUSCULAR | Status: AC
  Administered 2018-05-28: 250 mg via INTRAMUSCULAR
  Filled 2018-05-28: qty 250

## 2018-05-28 MED ORDER — AZITHROMYCIN 250 MG PO TABS
1000.0000 mg | ORAL_TABLET | Freq: Once | ORAL | Status: AC
  Administered 2018-05-28: 1000 mg via ORAL
  Filled 2018-05-28: qty 4

## 2018-05-28 NOTE — Discharge Instructions (Addendum)
Please read attached information. If you experience any new or worsening signs or symptoms please return to the emergency room for evaluation. Please follow-up with your primary care provider or specialist as discussed.  °

## 2018-05-28 NOTE — ED Triage Notes (Signed)
Pt states she has abdominal pain and vaginal aching that started this morning. Pt states she is [redacted] weeks pregnant. Pt also reports she had an episode of blood in her urine and thick white discharge.

## 2018-05-28 NOTE — ED Provider Notes (Signed)
[facility] Provider Note   CSN: [id number] Arrival date & time: 05/28/18  1223     History   Chief Complaint Chief Complaint  Patient presents with  . Abdominal Pain    HPI Melissa Gay is a 22 y.o. female.  HPI  22 year old female presents today with complaints of pelvic pain and vaginal bleeding.  Patient reports she is proximate 9 weeks.  She was seen on 05/14/2018 with ultrasound showing single live intrauterine pregnancy.  Patient notes that since that time she has had small amount of blood and discharge from her uterus, she notes pelvic pain as well.  She denies any fever, abdominal pain nausea vomiting.  Patient notes she has a follow-up with OB/GYN in approximately 1 month.   History reviewed. No pertinent past medical history.  There are no active problems to display for this patient.   History reviewed. No pertinent surgical history.   OB History    Gravida  1   Para      Term      Preterm      AB      Living        SAB      TAB      Ectopic      Multiple      Live Births               Home Medications    Prior to Admission medications   Medication Sig Start Date End Date Taking? Authorizing Provider  Prenat w/o A Vit-FeFum-FePo-FA (CONCEPT OB) 130-92.4-1 MG CAPS Take 1 capsule by mouth daily. 05/14/18   Arthor CaptainHarris, Abigail, PA-C    Family History History reviewed. No pertinent family history.  Social History Social History   Tobacco Use  . Smoking status: Never Smoker  . Smokeless tobacco: Never Used  Substance Use Topics  . Alcohol use: Not Currently  . Drug use: Never     Allergies   Patient has no known allergies.   Review of Systems Review of Systems  All other systems reviewed and are negative.   Physical Exam Updated Vital Signs BP 114/68 (BP Location: Right Arm)   Pulse 78   Temp 99.3 F (37.4 C) (Oral)   Resp 18   SpO2 96%   Physical Exam  Constitutional:  She is oriented to person, place, and time. She appears well-developed and well-nourished.  HENT:  Head: Normocephalic and atraumatic.  Eyes: Pupils are equal, round, and reactive to light. Conjunctivae are normal. Right eye exhibits no discharge. Left eye exhibits no discharge. No scleral icterus.  Neck: Normal range of motion. No JVD present. No tracheal deviation present.  Pulmonary/Chest: Effort normal. No stridor.  Genitourinary:  Genitourinary Comments: Purulent vaginal discharge, cervical friability noted, no bleeding from the cervical loss, no cervical motion tenderness adnexal tenderness or masses  Neurological: She is alert and oriented to person, place, and time. Coordination normal.  Psychiatric: She has a normal mood and affect. Her behavior is normal. Judgment and thought content normal.  Nursing note and vitals reviewed.    ED Treatments / Results  Labs (all labs ordered are listed, but only abnormal results are displayed) Labs Reviewed  WET PREP, GENITAL - Abnormal; Notable for the following components:      Result Value   WBC, Wet Prep HPF POC MANY (*)    All other components within normal limits  COMPREHENSIVE METABOLIC PANEL - Abnormal; Notable for the following components:  BUN <5 (*)    Alkaline Phosphatase 29 (*)    Total Bilirubin 0.2 (*)    All other components within normal limits  CBC - Abnormal; Notable for the following components:   Hemoglobin 10.5 (*)    HCT 33.5 (*)    MCV 76.5 (*)    MCH 24.0 (*)    All other components within normal limits  URINALYSIS, ROUTINE W REFLEX MICROSCOPIC - Abnormal; Notable for the following components:   APPearance HAZY (*)    Leukocytes, UA SMALL (*)    Bacteria, UA RARE (*)    All other components within normal limits  HCG, QUANTITATIVE, PREGNANCY - Abnormal; Notable for the following components:   hCG, Beta Chain, Quant, S 209,799 (*)    All other components within normal limits  LIPASE, BLOOD  GC/CHLAMYDIA  PROBE AMP ([place]) NOT AT [facility]    EKG None  Radiology No results found.  Procedures Procedures (including critical care time)  Medications Ordered in ED Medications  lidocaine (PF) (XYLOCAINE) 1 % injection (has no administration in time range)  cefTRIAXone (ROCEPHIN) injection 250 mg (250 mg Intramuscular Given 05/28/18 1655)  azithromycin (ZITHROMAX) tablet 1,000 mg (1,000 mg Oral Given 05/28/18 1656)     Initial Impression / Assessment and Plan / ED Course  I have reviewed the triage vital signs and the nursing notes.  Pertinent labs & imaging results that were available during my care of the patient were reviewed by me and considered in my medical decision making (see chart for details).     Labs: Wet prep, UA, lipase, CMP CBC, ECG  Imaging:  Consults:  Therapeutics: ceftriaxone, Azithromycin, lidocaine   Discharge Meds:   Assessment/Plan:   22 year old female presents today with complaints of pelvic pain and discharge likely secondary to STD.  Bedside ultrasound showed cardiac activity.  Patient had no significant bleeding from cervical eyes she does have friability and discharge.  Chart review shows that she did test positive for chlamydia but reports that she did not receive a follow-up phone call.  Patient will be treated here, tested again.  He has no signs of pelvic inflammatory disease.  She will be followed as an outpatient with her OB/GYN, she is given strict return precautions.  She verbalized understanding and agreement to today's plan.     Final Clinical Impressions(s) / ED Diagnoses   Final diagnoses:  Vaginal discharge    ED Discharge Orders    None       Rosalio Loud 05/28/18 1851    Tilden Fossa, MD 05/29/18 1146

## 2018-05-29 LAB — GC/CHLAMYDIA PROBE AMP (~~LOC~~) NOT AT ARMC
Chlamydia: POSITIVE — AB
Neisseria Gonorrhea: NEGATIVE

## 2018-06-21 ENCOUNTER — Encounter: Payer: Medicaid - Out of State | Admitting: Obstetrics and Gynecology

## 2018-06-21 ENCOUNTER — Telehealth: Payer: Self-pay | Admitting: General Practice

## 2018-06-21 NOTE — Telephone Encounter (Signed)
Patient missed appt today.  Called patient to reschedule, but no answer.  Left message on VM for patient to give our office a call back.

## 2018-11-03 ENCOUNTER — Other Ambulatory Visit: Payer: Self-pay

## 2018-11-03 ENCOUNTER — Emergency Department (HOSPITAL_COMMUNITY)
Admission: EM | Admit: 2018-11-03 | Discharge: 2018-11-03 | Disposition: A | Discharge status: Home / Self Care | Payer: Medicaid - Out of State | Attending: Emergency Medicine | Admitting: Emergency Medicine

## 2018-11-03 ENCOUNTER — Encounter (HOSPITAL_COMMUNITY): Payer: Self-pay | Admitting: *Deleted

## 2018-11-03 DIAGNOSIS — R109 Unspecified abdominal pain: Secondary | ICD-10-CM | POA: Insufficient documentation

## 2018-11-03 DIAGNOSIS — O26893 Other specified pregnancy related conditions, third trimester: Secondary | ICD-10-CM | POA: Insufficient documentation

## 2018-11-03 DIAGNOSIS — Z79899 Other long term (current) drug therapy: Secondary | ICD-10-CM | POA: Insufficient documentation

## 2018-11-03 DIAGNOSIS — O26892 Other specified pregnancy related conditions, second trimester: Secondary | ICD-10-CM

## 2018-11-03 LAB — URINALYSIS, ROUTINE W REFLEX MICROSCOPIC
Bilirubin Urine: NEGATIVE
GLUCOSE, UA: NEGATIVE mg/dL
HGB URINE DIPSTICK: NEGATIVE
KETONES UR: NEGATIVE mg/dL
NITRITE: NEGATIVE
PROTEIN: NEGATIVE mg/dL
Specific Gravity, Urine: 1.015 (ref 1.005–1.030)
pH: 8 (ref 5.0–8.0)

## 2018-11-03 LAB — WET PREP, GENITAL
Clue Cells Wet Prep HPF POC: NONE SEEN
Sperm: NONE SEEN
Trich, Wet Prep: NONE SEEN
Yeast Wet Prep HPF POC: NONE SEEN

## 2018-11-03 NOTE — ED Notes (Signed)
Paged ob rapid per charge

## 2018-11-03 NOTE — ED Notes (Signed)
A liter of lactated ringers was ordered by doctor  Order was never placed pt is going to be discharged  The entire liter was infused

## 2018-11-03 NOTE — Discharge Instructions (Signed)
Call the [facility] @ [phone], on Monday, 11/05/18 to set up prenatal care. Located at [address], [place], [place] [id number]. Tell them of ED visit, cultures collected at visit, give phone number to call you with results if results come back before your scheduled appointment.   If you have any concerns/problems/suspect preterm labor, etc. Please go to [facility] [place], located at [address] [place] [phone].

## 2018-11-03 NOTE — Progress Notes (Signed)
Spoke with Dr. Vergie LivingPickens. Pt is a G2P1 at 4131 6/[redacted] weeks gestation here with c/o lower back pain, abd pain, and pelvic pressure. Pt says she has recently moved here from [place]. FHR baseline is 145 BPM, mod variability, accels, no decels. 2 uc's noted on EFM. Cervix is closed, thk, posterior, presenting part is high. No vaginal bleeding is noted.

## 2018-11-03 NOTE — Progress Notes (Signed)
RROB called Dr Vergie LivingPickens, [facility] to make him aware of pt discharged as discussed earlier to do if no cervical change; pt's bp before discharged was 142/112; ED RN took pt's bp, then took her out via w/c; RROB was unaware of elevated bp until ED RN told RROB several minutes after pt had been d/c'd. Pt did report headaches throughout pregnancy, no pain in rt side under ribs, no protein in urine, no swelling, no visual disturbances. Dr said to call pt and ask her to come to MAU for bp check 11/04/18.    RROB called pt at 2325, but there was no answer and no voicemail to leave a message.

## 2018-11-03 NOTE — Progress Notes (Signed)
RROB called pt again at 2343, no answer and no voicemail available to leave message.

## 2018-11-03 NOTE — ED Provider Notes (Signed)
[facility] EMERGENCY [facility] Provider Note   CSN: [id number] Arrival date & time: 11/03/18  1825     History   Chief Complaint Chief Complaint  Patient presents with  . Vaginal Bleeding    HPI Melissa Gay is a 22 y.o. female.  HPI Patient presents with abdominal pain. Patient is G2, P1 approximately 32 weeks pregnancy, unremarkable according to her. She moved here from [place] yesterday. Onset of pain was about half an hour ago, and she had production of some clear liquid, she is unsure of it being urine or her fluid break. Pain is left-sided, sore, not necessarily consistent with prior uterine contractions during her first pregnancy. No other complaints include chest pain, dyspnea, syncope.  History reviewed. No pertinent past medical history.  There are no active problems to display for this patient.   History reviewed. No pertinent surgical history.   OB History    Gravida  1   Para      Term      Preterm      AB      Living        SAB      TAB      Ectopic      Multiple      Live Births               Home Medications    Prior to Admission medications   Medication Sig Start Date End Date Taking? Authorizing Provider  Prenat w/o A Vit-FeFum-FePo-FA (CONCEPT OB) 130-92.4-1 MG CAPS Take 1 capsule by mouth daily. 05/14/18   Arthor CaptainHarris, Abigail, PA-C    Family History No family history on file.  Social History Social History   Tobacco Use  . Smoking status: Never Smoker  . Smokeless tobacco: Never Used  Substance Use Topics  . Alcohol use: Not Currently  . Drug use: Never     Allergies   Patient has no known allergies.   Review of Systems Review of Systems  Constitutional:       Per HPI, otherwise negative  HENT:       Per HPI, otherwise negative  Respiratory:       Per HPI, otherwise negative  Cardiovascular:       Per HPI, otherwise negative  Gastrointestinal: Negative for vomiting.    Endocrine:       Negative aside from HPI  Genitourinary:       Neg aside from HPI   Musculoskeletal:       Per HPI, otherwise negative  Skin: Negative.   Neurological: Negative for syncope.     Physical Exam Updated Vital Signs BP 128/60 (BP Location: Right Arm)   Pulse 99   Temp 99.1 F (37.3 C) (Oral)   Resp 16   Ht 5\' 3"  (1.6 m)   Wt 67.1 kg   LMP 04/12/2018   SpO2 100%   BMI 26.22 kg/m   Physical Exam  Constitutional: She is oriented to person, place, and time. She appears well-developed and well-nourished. No distress.  HENT:  Head: Normocephalic and atraumatic.  Eyes: Conjunctivae and EOM are normal.  Cardiovascular: Normal rate and regular rhythm.  Pulmonary/Chest: Effort normal and breath sounds normal. No stridor. No respiratory distress.  Abdominal: She exhibits no distension.  Gravid, essentially nontender abdomen  Genitourinary: Cervix exhibits no motion tenderness. No tenderness in the vagina. Vaginal discharge found.    Musculoskeletal: She exhibits no edema.  Neurological: She is alert and oriented to person,  place, and time. No cranial nerve deficit.  Skin: Skin is warm and dry.  Psychiatric: She has a normal mood and affect.  Nursing note and vitals reviewed.    ED Treatments / Results  Labs (all labs ordered are listed, but only abnormal results are displayed) Labs Reviewed  URINALYSIS, ROUTINE W REFLEX MICROSCOPIC - Abnormal; Notable for the following components:      Result Value   APPearance HAZY (*)    Leukocytes, UA SMALL (*)    Bacteria, UA RARE (*)    All other components within normal limits  WET PREP, GENITAL  GC/CHLAMYDIA PROBE AMP ([place]) NOT AT [facility]    Procedures Pelvic exam Date/Time: 11/03/2018 9:31 PM Performed by: Gerhard Munch, MD Authorized by: Gerhard Munch, MD  Consent: Verbal consent obtained. Risks and benefits: risks, benefits and alternatives were discussed Consent given by: patient Patient  understanding: patient states understanding of the procedure being performed Patient consent: the patient's understanding of the procedure matches consent given Procedure consent: procedure consent matches procedure scheduled Required items: required blood products, implants, devices, and special equipment available Patient identity confirmed: verbally with patient Time out: Immediately prior to procedure a "time out" was called to verify the correct patient, procedure, equipment, support staff and site/side marked as required. Preparation: Patient was prepped and draped in the usual sterile fashion. Local anesthesia used: no  Anesthesia: Local anesthesia used: no  Sedation: Patient sedated: no  Patient tolerance: Patient tolerated the procedure well with no immediate complications    (including critical care time)  Medications Ordered in ED Medications - No data to display   Initial Impression / Assessment and Plan / ED Course  I have reviewed the triage vital signs and the nursing notes.  Pertinent labs & imaging results that were available during my care of the patient were reviewed by me and considered in my medical decision making (see chart for details).     Immediately after my initial evaluation we discussed the patient's case with her obstetrics rapid response team, and the patient was placed on continuous fetal monitoring. On monitoring device, the patient had fetal heart tones in the 140s.   9:30 PM Patient in no distress, awake, alert. After monitoring, there is been no appreciable change in the fetus, I have discussed her case again with her OB rapid response team, and patient is appropriate for discharge with close outpatient follow-up. Urinalysis unremarkable, and a discharge gonorrhea chlamydia labs are pending.   Final Clinical Impressions(s) / ED Diagnoses  Abdominal pain in pregnancy   Gerhard Munch, MD 11/03/18 2132

## 2018-11-03 NOTE — Progress Notes (Signed)
Pt is a G2P1 at 1131 6/[redacted] weeks gestation presenting with c/o lower abd pain, back pain, and vaginal pressure. Says she has had some spotting today, but denies any leaking of fluid. Says she has recently moved here from [place] and was getting regular PNC there. Denies any problems with this pregnancy or with hewr first pregnancy. She says she had a vaginal delivery with her first baby. Says she started having pain about 1 hour ago.

## 2018-11-03 NOTE — ED Notes (Signed)
Rapid response ob here

## 2018-11-03 NOTE — ED Notes (Signed)
This note also relates to the following rows which could not be included: Pulse Rate - Cannot attach notes to unvalidated device data SpO2 - Cannot attach notes to unvalidated device data  RROB K.Wendie SimmerForsell, RNC [facility], Dr Vergie LivingPickens; requested orders for u/a, wet prep, and G/Chlamydia; orders received

## 2018-11-03 NOTE — ED Triage Notes (Signed)
[place]  She is [redacted] weeks pregnant 2nd pregnancy  She has had 2 contractions all day and currently has llq pain  Some vaginal spotting or bleeding when she urinated about 20 minutes ago   She has had some pressure lower back and abd for awhjile

## 2018-11-03 NOTE — Progress Notes (Signed)
[facility] RNC-[facility].

## 2018-11-04 NOTE — Progress Notes (Signed)
Addendum- 11/04/18 @ 1325, I spoke with the pt and asked [facility] for [facility] blood pressure check. [facility] and was not rechecked. Pt says she will come in. Denies headache, blurred vision, at this time. MAU charge nurse, Trula OreChristina notified that pt would be coming in. Shabnam Ladd RNC-OB

## 2018-11-05 LAB — GC/CHLAMYDIA PROBE AMP (~~LOC~~) NOT AT ARMC
Chlamydia: NEGATIVE
Neisseria Gonorrhea: NEGATIVE

## 2018-11-16 ENCOUNTER — Encounter: Payer: Self-pay | Admitting: Student

## 2018-12-04 ENCOUNTER — Emergency Department (HOSPITAL_COMMUNITY)
Admission: EM | Admit: 2018-12-04 | Discharge: 2018-12-04 | Disposition: A | Discharge status: Home / Self Care | Payer: Self-pay | Attending: Emergency Medicine | Admitting: Emergency Medicine

## 2018-12-04 ENCOUNTER — Other Ambulatory Visit: Payer: Self-pay

## 2018-12-04 ENCOUNTER — Emergency Department (HOSPITAL_COMMUNITY): Payer: Self-pay

## 2018-12-04 ENCOUNTER — Encounter (HOSPITAL_COMMUNITY): Payer: Self-pay

## 2018-12-04 DIAGNOSIS — R51 Headache: Secondary | ICD-10-CM | POA: Insufficient documentation

## 2018-12-04 DIAGNOSIS — J069 Acute upper respiratory infection, unspecified: Secondary | ICD-10-CM | POA: Insufficient documentation

## 2018-12-04 DIAGNOSIS — R1030 Lower abdominal pain, unspecified: Secondary | ICD-10-CM | POA: Insufficient documentation

## 2018-12-04 DIAGNOSIS — R748 Abnormal levels of other serum enzymes: Secondary | ICD-10-CM | POA: Insufficient documentation

## 2018-12-04 DIAGNOSIS — Z3A37 37 weeks gestation of pregnancy: Secondary | ICD-10-CM | POA: Insufficient documentation

## 2018-12-04 DIAGNOSIS — O26893 Other specified pregnancy related conditions, third trimester: Secondary | ICD-10-CM | POA: Insufficient documentation

## 2018-12-04 LAB — COMPREHENSIVE METABOLIC PANEL
ALBUMIN: 2.8 g/dL — AB (ref 3.5–5.0)
ALK PHOS: 173 U/L — AB (ref 38–126)
ALT: 78 U/L — ABNORMAL HIGH (ref 0–44)
ANION GAP: 12 (ref 5–15)
AST: 68 U/L — ABNORMAL HIGH (ref 15–41)
BILIRUBIN TOTAL: 0.9 mg/dL (ref 0.3–1.2)
BUN: <5 mg/dL — ABNORMAL LOW (ref 6–20)
CALCIUM: 8.8 mg/dL — AB (ref 8.9–10.3)
CO2: 19 mmol/L — ABNORMAL LOW (ref 22–32)
Chloride: 105 mmol/L (ref 98–111)
Creatinine, Ser: 0.54 mg/dL (ref 0.44–1.00)
GFR calc Af Amer: >60 mL/min (ref >60)
GFR calc non Af Amer: >60 mL/min (ref >60)
GLUCOSE: 74 mg/dL (ref 70–99)
Potassium: 3.8 mmol/L (ref 3.5–5.1)
Sodium: 136 mmol/L (ref 135–145)
TOTAL PROTEIN: 7.1 g/dL (ref 6.5–8.1)

## 2018-12-04 LAB — CBC WITH DIFFERENTIAL/PLATELET
Abs Immature Granulocytes: 0.13 10*3/uL — ABNORMAL HIGH (ref 0.00–0.07)
BASOS PCT: 0 %
Basophils Absolute: 0 10*3/uL (ref 0.0–0.1)
EOS ABS: 0 10*3/uL (ref 0.0–0.5)
Eosinophils Relative: 0 %
HCT: 31.9 % — ABNORMAL LOW (ref 36.0–46.0)
Hemoglobin: 10 g/dL — ABNORMAL LOW (ref 12.0–15.0)
IMMATURE GRANULOCYTES: 2 %
Lymphocytes Relative: 17 %
Lymphs Abs: 1.4 10*3/uL (ref 0.7–4.0)
MCH: 24.4 pg — AB (ref 26.0–34.0)
MCHC: 31.3 g/dL (ref 30.0–36.0)
MCV: 78 fL — AB (ref 80.0–100.0)
MONOS PCT: 8 %
Monocytes Absolute: 0.6 10*3/uL (ref 0.1–1.0)
NEUTROS PCT: 73 %
Neutro Abs: 5.8 10*3/uL (ref 1.7–7.7)
PLATELETS: 170 10*3/uL (ref 150–400)
RBC: 4.09 MIL/uL (ref 3.87–5.11)
RDW: 14.8 % (ref 11.5–15.5)
WBC: 8 10*3/uL (ref 4.0–10.5)
nRBC: 0 % (ref 0.0–0.2)

## 2018-12-04 LAB — URINALYSIS, ROUTINE W REFLEX MICROSCOPIC
Bilirubin Urine: NEGATIVE
GLUCOSE, UA: NEGATIVE mg/dL
HGB URINE DIPSTICK: NEGATIVE
Ketones, ur: NEGATIVE mg/dL
NITRITE: NEGATIVE
PH: 6 (ref 5.0–8.0)
Protein, ur: NEGATIVE mg/dL
SPECIFIC GRAVITY, URINE: 1.018 (ref 1.005–1.030)

## 2018-12-04 LAB — INFLUENZA PANEL BY PCR (TYPE A & B)
INFLAPCR: NEGATIVE
INFLBPCR: NEGATIVE

## 2018-12-04 LAB — LIPASE, BLOOD: Lipase: 20 U/L (ref 11–51)

## 2018-12-04 NOTE — ED Notes (Addendum)
No orders received and is patient cleared by Obstetrics at this time.  Pt in care of ED provider at this time.  Communicated to patient ED nurse, [place],  to contact [facility] if urine results come back suspicious for pyelonephritis and information will be communicated to Dr Vergie LivingPickens.

## 2018-12-04 NOTE — ED Notes (Signed)
This note also relates to the following rows which could not be included: BP - Cannot attach notes to unvalidated device data Pulse Rate - Cannot attach notes to unvalidated device data SpO2 - Cannot attach notes to unvalidated device data  [facility] for generalized body aches. NST began @ 1646.

## 2018-12-04 NOTE — ED Notes (Signed)
Patient verbalizes understanding of discharge instructions. Opportunity for questioning and answers were provided. Armband removed by staff, pt discharged from ED ambulatory with son.  

## 2018-12-04 NOTE — ED Provider Notes (Signed)
[facility] EMERGENCY DEPARTMENT Provider Note   CSN: [id number] Arrival date & time: 12/04/18  1551     History   Chief Complaint Chief Complaint  Patient presents with  . Generalized Body Aches    HPI Melissa Gay is a 22 y.o. G2P1 female who presents with URI symptoms and abdominal pain. She is [redacted] weeks pregnant. She states that for the past week she has had URI symptoms including a headache, runny nose, sneezing, sore throat, and coughing. She states her mom has had the flu for the past 2 weeks and her son has had a cold. She had a subjective fever last night. She denies chest pain, SOB, wheezing. Over the past couple days she has been having right lower pelvic, right flank, and back pain. The pain feels like "knuckles" in her side. She had this same pain last month in the ED but it was on the left. She states that pain never really went away but just got more severe over the past couple days. She reports associated N/V/D a couple days ago but not today. She denies vaginal bleeding, leakage of fluids. She feels baby moving. No urinary symptoms. She does not have an OBGYN in the area. She moved here about 1 month ago. She was seen in the ED on Nov 23rd and was cleared by OBGYN.  HPI  History reviewed. No pertinent past medical history.  There are no active problems to display for this patient.   History reviewed. No pertinent surgical history.   OB History    Gravida  1   Para      Term      Preterm      AB      Living        SAB      TAB      Ectopic      Multiple      Live Births               Home Medications    Prior to Admission medications   Medication Sig Start Date End Date Taking? Authorizing Provider  Prenat w/o A Vit-FeFum-FePo-FA (CONCEPT OB) 130-92.4-1 MG CAPS Take 1 capsule by mouth daily. 05/14/18   Arthor Captain, PA-C    Family History History reviewed. No pertinent family history.  Social  History Social History   Tobacco Use  . Smoking status: Never Smoker  . Smokeless tobacco: Never Used  Substance Use Topics  . Alcohol use: Not Currently  . Drug use: Never     Allergies   Citrus   Review of Systems Review of Systems  Constitutional: Positive for fatigue and fever. Negative for chills.  HENT: Positive for congestion, rhinorrhea and sore throat. Negative for ear pain.   Respiratory: Positive for cough. Negative for shortness of breath.   Cardiovascular: Negative for chest pain.  Gastrointestinal: Positive for abdominal pain, diarrhea, nausea and vomiting.  Genitourinary: Positive for pelvic pain. Negative for dysuria, frequency, vaginal bleeding and vaginal discharge.  Neurological: Positive for headaches.  All other systems reviewed and are negative.    Physical Exam Updated Vital Signs BP 128/76 (BP Location: Right Arm)   Pulse 97   Temp 98.3 F (36.8 C) (Oral)   Resp 18   Ht 5\' 4"  (1.626 m)   Wt 64.4 kg   LMP 04/12/2018   SpO2 100%   BMI 24.37 kg/m   Physical Exam Vitals signs and nursing note reviewed.  Constitutional:  General: She is not in acute distress.    Appearance: Normal appearance. She is well-developed.     Comments: Calm and cooperative. Well appearing  HENT:     Head: Normocephalic and atraumatic.  Eyes:     General: No scleral icterus.       Right eye: No discharge.        Left eye: No discharge.     Conjunctiva/sclera: Conjunctivae normal.     Pupils: Pupils are equal, round, and reactive to light.  Neck:     Musculoskeletal: Normal range of motion.  Cardiovascular:     Rate and Rhythm: Normal rate and regular rhythm.  Pulmonary:     Effort: Pulmonary effort is normal. No respiratory distress.     Breath sounds: Normal breath sounds.  Abdominal:     General: There is no distension.     Tenderness: There is abdominal tenderness (suprapubic, RLQ, R flank).     Comments: Gravid uterus  Skin:    General: Skin is  warm and dry.  Neurological:     Mental Status: She is alert and oriented to person, place, and time.  Psychiatric:        Behavior: Behavior normal.      ED Treatments / Results  Labs (all labs ordered are listed, but only abnormal results are displayed) Labs Reviewed  URINALYSIS, ROUTINE W REFLEX MICROSCOPIC - Abnormal; Notable for the following components:      Result Value   APPearance HAZY (*)    Leukocytes, UA MODERATE (*)    Bacteria, UA RARE (*)    All other components within normal limits  COMPREHENSIVE METABOLIC PANEL - Abnormal; Notable for the following components:   CO2 19 (*)    BUN <5 (*)    Calcium 8.8 (*)    Albumin 2.8 (*)    AST 68 (*)    ALT 78 (*)    Alkaline Phosphatase 173 (*)    All other components within normal limits  CBC WITH DIFFERENTIAL/PLATELET - Abnormal; Notable for the following components:   Hemoglobin 10.0 (*)    HCT 31.9 (*)    MCV 78.0 (*)    MCH 24.4 (*)    Abs Immature Granulocytes 0.13 (*)    All other components within normal limits  INFLUENZA PANEL BY PCR (TYPE A & B)  LIPASE, BLOOD    EKG None  Radiology [place] Abdomen Limited Ruq  Result Date: 12/04/2018 CLINICAL DATA:  Elevated LFTs EXAM: ULTRASOUND ABDOMEN LIMITED RIGHT UPPER QUADRANT COMPARISON:  None. FINDINGS: Gallbladder: No gallstones or wall thickening visualized. No sonographic Murphy sign noted by sonographer. Common bile duct: Diameter: 3.3 mm Liver: No focal lesion identified. Within normal limits in parenchymal echogenicity. Portal vein is patent on color Doppler imaging with normal direction of blood flow towards the liver. IMPRESSION: No acute abnormality noted. Electronically Signed   By: Alcide CleverMark  Lukens M.D.   On: 12/04/2018 20:52    Procedures Procedures (including critical care time)  Medications Ordered in ED Medications - No data to display   Initial Impression / Assessment and Plan / ED Course  I have reviewed the triage vital signs and the nursing  notes.  Pertinent labs & imaging results that were available during my care of the patient were reviewed by me and considered in my medical decision making (see chart for details).  22 year old female presents with multiple complaints but mainly URI symptoms and abdominal pain. Vitals are normal. HENT exam is unremarkable.  Heart is regular rate and rhythm. Lungs are CTA. Abdomen is very tender in the lower abdomen and flank. She states this has been going on for over a month now and is the same pain she had on her last ED visit. Rapid response RN evaluated her and was cleared. CBC is remarkable for anemia. CMP is remarkable for elevation of AP, AST, ALT. UA is not concerning for infection. Flu is negative. RUQ was obtained which is normal. Symptoms are not consistent with appendicitis. She was strongly encouraged to f/u with OBGYN for further prenatal care. She was also advised to not take Ibuprofen and to only take Tylenol for pain. She was also advised to try heat. Shared visit with Dr. Rush Landmarkegeler. Return precautions given.  Final Clinical Impressions(s) / ED Diagnoses   Final diagnoses:  Alkaline phosphatase elevation  Lower abdominal pain  [redacted] weeks gestation of pregnancy  Upper respiratory tract infection, unspecified type    ED Discharge Orders    None       Bethel BornGekas, Lorann Tani Marie, PA-C 12/05/18 0001    Tegeler, Canary Brimhristopher J, MD 12/05/18 [phone]

## 2018-12-04 NOTE — ED Triage Notes (Signed)
Pt arrives POV for eval of general malaise, body aches, back/belly pain (constant in nature x days). Pt reports fever at home 2 days ago. Reports ibuprofen use at home has been helping.

## 2018-12-04 NOTE — Discharge Instructions (Signed)
Take Tylenol and use a heating pad on the abdomen for pain Please follow up with OBGYN Return if you are worsening

## 2018-12-04 NOTE — ED Notes (Signed)
CALLED OB RAPID PER RN ANNIE

## 2018-12-04 NOTE — ED Notes (Signed)
Fetal Heart Tones 138

## 2018-12-12 NOTE — L&D Delivery Note (Signed)
Delivery Note At 4:33 PM a viable female was delivered via  (Presentation: LOT ;LOA  ).  APGAR:9, 9weight [id number] . Placenta status:intact ,spontaneously delivered with gentle cord traction and good maternal effort. Cord: three vessels with the following complications :loose nuchal reduced via somersault maneuver without difficulty. Infant vigorous and vocalizing just after delivery of head.  Fundus firm with massage and Pitocin.  Anesthesia:  epidural Lacerations:  N/A intact perineum  Q Blood Loss (mL):  72mL  Mom to postpartum.  Baby to Couplet care / Skin to Skin.  Calvert CantorSamantha C Weinhold, CNM 12/21/2018, 4:52 PM

## 2018-12-20 ENCOUNTER — Other Ambulatory Visit: Payer: Self-pay

## 2018-12-20 ENCOUNTER — Inpatient Hospital Stay (HOSPITAL_BASED_OUTPATIENT_CLINIC_OR_DEPARTMENT_OTHER): Payer: Self-pay

## 2018-12-20 ENCOUNTER — Encounter (HOSPITAL_COMMUNITY): Payer: Self-pay | Admitting: *Deleted

## 2018-12-20 ENCOUNTER — Inpatient Hospital Stay (HOSPITAL_COMMUNITY)
Admission: AD | Admit: 2018-12-20 | Discharge: 2018-12-23 | DRG: 807 | Disposition: A | Discharge status: Home / Self Care | Payer: Self-pay | Attending: Obstetrics and Gynecology | Admitting: Obstetrics and Gynecology

## 2018-12-20 DIAGNOSIS — Z3A38 38 weeks gestation of pregnancy: Secondary | ICD-10-CM

## 2018-12-20 DIAGNOSIS — O9902 Anemia complicating childbirth: Secondary | ICD-10-CM | POA: Diagnosis present

## 2018-12-20 DIAGNOSIS — O0933 Supervision of pregnancy with insufficient antenatal care, third trimester: Secondary | ICD-10-CM

## 2018-12-20 DIAGNOSIS — D573 Sickle-cell trait: Secondary | ICD-10-CM | POA: Diagnosis present

## 2018-12-20 DIAGNOSIS — Z9189 Other specified personal risk factors, not elsewhere classified: Secondary | ICD-10-CM

## 2018-12-20 DIAGNOSIS — R748 Abnormal levels of other serum enzymes: Secondary | ICD-10-CM | POA: Diagnosis present

## 2018-12-20 DIAGNOSIS — O26893 Other specified pregnancy related conditions, third trimester: Secondary | ICD-10-CM | POA: Diagnosis present

## 2018-12-20 DIAGNOSIS — O4693 Antepartum hemorrhage, unspecified, third trimester: Secondary | ICD-10-CM

## 2018-12-20 HISTORY — DX: Other specified health status: Z78.9

## 2018-12-20 HISTORY — DX: Sickle-cell trait: D57.3

## 2018-12-20 LAB — COMPREHENSIVE METABOLIC PANEL
ALBUMIN: 3.3 g/dL — AB (ref 3.5–5.0)
ALT: 71 U/L — ABNORMAL HIGH (ref 0–44)
ALT: 74 U/L — ABNORMAL HIGH (ref 0–44)
AST: 56 U/L — AB (ref 15–41)
AST: 57 U/L — ABNORMAL HIGH (ref 15–41)
Albumin: 2.9 g/dL — ABNORMAL LOW (ref 3.5–5.0)
Alkaline Phosphatase: 207 U/L — ABNORMAL HIGH (ref 38–126)
Alkaline Phosphatase: 223 U/L — ABNORMAL HIGH (ref 38–126)
Anion gap: 9 (ref 5–15)
Anion gap: 9 (ref 5–15)
BUN: 7 mg/dL (ref 6–20)
BUN: 6 mg/dL (ref 6–20)
CHLORIDE: 102 mmol/L (ref 98–111)
CHLORIDE: 100 mmol/L (ref 98–111)
CO2: 20 mmol/L — AB (ref 22–32)
CO2: 24 mmol/L (ref 22–32)
Calcium: 8.4 mg/dL — ABNORMAL LOW (ref 8.9–10.3)
Calcium: 8.8 mg/dL — ABNORMAL LOW (ref 8.9–10.3)
Creatinine, Ser: 0.48 mg/dL (ref 0.44–1.00)
Creatinine, Ser: 0.51 mg/dL (ref 0.44–1.00)
GFR calc Af Amer: >60 mL/min (ref >60)
GFR calc non Af Amer: >60 mL/min (ref >60)
Glucose, Bld: 89 mg/dL (ref 70–99)
Glucose, Bld: 90 mg/dL (ref 70–99)
POTASSIUM: 3.5 mmol/L (ref 3.5–5.1)
Potassium: 3.1 mmol/L — ABNORMAL LOW (ref 3.5–5.1)
SODIUM: 131 mmol/L — AB (ref 135–145)
Sodium: 133 mmol/L — ABNORMAL LOW (ref 135–145)
Total Bilirubin: 0.5 mg/dL (ref 0.3–1.2)
Total Bilirubin: 1 mg/dL (ref 0.3–1.2)
Total Protein: 6.8 g/dL (ref 6.5–8.1)
Total Protein: 7.9 g/dL (ref 6.5–8.1)

## 2018-12-20 LAB — WET PREP, GENITAL
Sperm: NONE SEEN
Trich, Wet Prep: NONE SEEN
Yeast Wet Prep HPF POC: NONE SEEN

## 2018-12-20 LAB — URINALYSIS, ROUTINE W REFLEX MICROSCOPIC
BILIRUBIN URINE: NEGATIVE
Glucose, UA: NEGATIVE mg/dL
Hgb urine dipstick: NEGATIVE
Ketones, ur: NEGATIVE mg/dL
NITRITE: NEGATIVE
PROTEIN: NEGATIVE mg/dL
Specific Gravity, Urine: 1.016 (ref 1.005–1.030)
pH: 6 (ref 5.0–8.0)

## 2018-12-20 LAB — PROTEIN / CREATININE RATIO, URINE
CREATININE, URINE: 167 mg/dL
Protein Creatinine Ratio: 0.08 mg/mg{Cre} (ref 0.00–0.15)
TOTAL PROTEIN, URINE: 14 mg/dL

## 2018-12-20 LAB — TYPE AND SCREEN
ABO/RH(D): O POS
Antibody Screen: NEGATIVE

## 2018-12-20 LAB — RAPID URINE DRUG SCREEN, HOSP PERFORMED
Amphetamines: NOT DETECTED
Barbiturates: NOT DETECTED
Benzodiazepines: NOT DETECTED
COCAINE: NOT DETECTED
OPIATES: NOT DETECTED
Tetrahydrocannabinol: NOT DETECTED

## 2018-12-20 LAB — ABO/RH: ABO/RH(D): O POS

## 2018-12-20 MED ORDER — LIDOCAINE HCL (PF) 1 % IJ SOLN
30.0000 mL | INTRAMUSCULAR | Status: DC | PRN
  Filled 2018-12-20: qty 30

## 2018-12-20 MED ORDER — FENTANYL CITRATE (PF) 100 MCG/2ML IJ SOLN
100.0000 ug | INTRAMUSCULAR | Status: DC | PRN
  Administered 2018-12-20 – 2018-12-21 (×3): 100 ug via INTRAVENOUS
  Filled 2018-12-20 (×2): qty 2

## 2018-12-20 MED ORDER — LACTATED RINGERS IV SOLN
INTRAVENOUS | Status: DC
  Administered 2018-12-20 – 2018-12-21 (×5): via INTRAVENOUS

## 2018-12-20 MED ORDER — OXYCODONE-ACETAMINOPHEN 5-325 MG PO TABS: 2.0000 | ORAL_TABLET | ORAL | Status: DC | PRN

## 2018-12-20 MED ORDER — SOD CITRATE-CITRIC ACID 500-334 MG/5ML PO SOLN: 30.0000 mL | ORAL | Status: DC | PRN

## 2018-12-20 MED ORDER — OXYCODONE-ACETAMINOPHEN 5-325 MG PO TABS: 1.0000 | ORAL_TABLET | ORAL | Status: DC | PRN

## 2018-12-20 MED ORDER — ACETAMINOPHEN 325 MG PO TABS: 650.0000 mg | ORAL_TABLET | ORAL | Status: DC | PRN

## 2018-12-20 MED ORDER — OXYTOCIN BOLUS FROM INFUSION: 500.0000 mL | Freq: Once | INTRAVENOUS | Status: DC

## 2018-12-20 MED ORDER — BUTALBITAL-APAP-CAFFEINE 50-325-40 MG PO TABS
2.0000 | ORAL_TABLET | Freq: Once | ORAL | Status: AC
  Administered 2018-12-20: 2 via ORAL
  Filled 2018-12-20: qty 2

## 2018-12-20 MED ORDER — LACTATED RINGERS IV SOLN
500.0000 mL | INTRAVENOUS | Status: DC | PRN
  Administered 2018-12-21: 500 mL via INTRAVENOUS

## 2018-12-20 MED ORDER — MISOPROSTOL 50MCG HALF TABLET
50.0000 ug | ORAL_TABLET | ORAL | Status: DC
  Administered 2018-12-20: 50 ug via ORAL
  Filled 2018-12-20 (×6): qty 1

## 2018-12-20 MED ORDER — FLEET ENEMA 7-19 GM/118ML RE ENEM: 1.0000 | ENEMA | RECTAL | Status: DC | PRN

## 2018-12-20 MED ORDER — ONDANSETRON HCL 4 MG/2ML IJ SOLN
4.0000 mg | Freq: Four times a day (QID) | INTRAMUSCULAR | Status: DC | PRN
  Administered 2018-12-21: 4 mg via INTRAVENOUS
  Filled 2018-12-20: qty 2

## 2018-12-20 MED ORDER — FENTANYL CITRATE (PF) 100 MCG/2ML IJ SOLN
INTRAMUSCULAR | Status: AC
  Administered 2018-12-20: 100 ug via INTRAVENOUS
  Filled 2018-12-20: qty 2

## 2018-12-20 MED ORDER — MISOPROSTOL 50MCG HALF TABLET
50.0000 ug | ORAL_TABLET | ORAL | Status: DC
  Filled 2018-12-20: qty 1

## 2018-12-20 MED ORDER — OXYTOCIN 40 UNITS IN NORMAL SALINE INFUSION - SIMPLE MED
2.5000 [IU]/h | INTRAVENOUS | Status: DC
  Administered 2018-12-21: 2.5 [IU]/h via INTRAVENOUS
  Filled 2018-12-20: qty 1000

## 2018-12-20 NOTE — H&P (Signed)
LABOR AND DELIVERY ADMISSION HISTORY AND PHYSICAL NOTE  Melissa Gay is a 23 y.o. female G2P1001 with IUP at [redacted]w[redacted]d by 1st-trimester [place] presenting for IOL.   She reports positive fetal movement. She report leakage of fluid and minimal vaginal bleeding.  Prenatal History/Complications: PNC in [place], [place] (moved to [facility] for family, Nov 2019) Pregnancy complications:  - None  Past Medical History: Past Medical History:  Diagnosis Date  . Medical history non-contributory   . Sickle cell trait [facility])     Past Surgical History: Past Surgical History:  Procedure Laterality Date  . NO PAST SURGERIES    . WISDOM TOOTH EXTRACTION      Obstetrical History: OB History    Gravida  2   Para  1   Term  1   Preterm      AB      Living  1     SAB      TAB      Ectopic      Multiple      Live Births              Social History: Social History   Socioeconomic History  . Marital status: Single    Spouse name: Not on file  . Number of children: Not on file  . Years of education: Not on file  . Highest education level: Not on file  Occupational History  . Not on file  Social Needs  . Financial resource strain: Not on file  . Food insecurity:    Worry: Not on file    Inability: Not on file  . Transportation needs:    Medical: Not on file    Non-medical: Not on file  Tobacco Use  . Smoking status: Never Smoker  . Smokeless tobacco: Never Used  Substance and Sexual Activity  . Alcohol use: Not Currently  . Drug use: Never  . Sexual activity: Yes  Lifestyle  . Physical activity:    Days per week: Not on file    Minutes per session: Not on file  . Stress: Not on file  Relationships  . Social connections:    Talks on phone: Not on file    Gets together: Not on file    Attends religious service: Not on file    Active member of club or organization: Not on file    Attends meetings of clubs or organizations: Not on file    Relationship status:  Not on file  Other Topics Concern  . Not on file  Social History Narrative  . Not on file   Family History: Patient's Dad: sickle cell trait, asthma Patient's Mom: Hypertension  Allergies: Allergies  Allergen Reactions  . Citrus Rash  Reaction: Anaphylaxis Codeine: Hives, rash  Medications Prior to Admission  Medication Sig Dispense Refill Last Dose  . Prenat w/o A Vit-FeFum-FePo-FA (CONCEPT OB) 130-92.4-1 MG CAPS Take 1 capsule by mouth daily. (Patient not taking: Reported on 12/04/2018) 30 capsule 12 Not Taking at Unknown time   Review of Systems  All systems reviewed and negative except as stated in HPI  Physical Exam Blood pressure 109/67, pulse 78, temperature 98.3 F (36.8 C), temperature source Oral, resp. rate 16, height 5\' 4"  (1.626 m), weight 69.4 kg, last menstrual period 04/12/2018, SpO2 98 %. General appearance: alert, oriented, NAD Lungs: normal respiratory effort Heart: regular rate Abdomen: soft, non-tender; gravid, FH appropriate for GA Extremities: No calf swelling or tenderness Presentation: cephalic   Prenatal labs: ABO,  Rh: --/--/O POS (01/09 1512) Antibody: NEG (01/09 1512) Rubella:  unknown RPR:   unknown HBsAg:   unknown HIV:   unknown GC/Chlamydia: Negative/Negative GBS:   Pending 2-hr GTT: unknown Genetic screening: Research officer, trade unioneagtive Anatomy US: unknown  Prenatal Transfer Tool  Maternal Diabetes: No Genetic Screening: Normal Maternal Ultrasounds/Referrals: Normal Fetal Ultrasounds or other Referrals:  None Maternal Substance Abuse:  No Significant Maternal Medications:  None Significant Maternal Lab Results: None  Results for orders placed or performed during the hospital encounter of 12/20/18 (from the past 24 hour(s))  Urinalysis, Routine w reflex microscopic   Collection Time: 12/20/18  2:56 PM  Result Value Ref Range   Color, Urine YELLOW YELLOW   APPearance CLEAR CLEAR   Specific Gravity, Urine 1.016 1.005 - 1.030   pH 6.0 5.0 - 8.0    Glucose, UA NEGATIVE NEGATIVE mg/dL   Hgb urine dipstick NEGATIVE NEGATIVE   Bilirubin Urine NEGATIVE NEGATIVE   Ketones, ur NEGATIVE NEGATIVE mg/dL   Protein, ur NEGATIVE NEGATIVE mg/dL   Nitrite NEGATIVE NEGATIVE   Leukocytes, UA MODERATE (A) NEGATIVE   RBC / HPF 0-5 0 - 5 RBC/hpf   WBC, UA 0-5 0 - 5 WBC/hpf   Bacteria, UA RARE (A) NONE SEEN   Squamous Epithelial / LPF 0-5 0 - 5   Mucus PRESENT   Protein / creatinine ratio, urine   Collection Time: 12/20/18  2:56 PM  Result Value Ref Range   Creatinine, Urine 167.00 mg/dL   Total Protein, Urine 14 mg/dL   Protein Creatinine Ratio 0.08 0.00 - 0.15 mg/mg[Cre]  Urine rapid drug screen (hosp performed)   Collection Time: 12/20/18  2:56 PM  Result Value Ref Range   Opiates NONE DETECTED NONE DETECTED   Cocaine NONE DETECTED NONE DETECTED   Benzodiazepines NONE DETECTED NONE DETECTED   Amphetamines NONE DETECTED NONE DETECTED   Tetrahydrocannabinol NONE DETECTED NONE DETECTED   Barbiturates NONE DETECTED NONE DETECTED  CBC   Collection Time: 12/20/18  3:12 PM  Result Value Ref Range   WBC 7.1 4.0 - 10.5 K/uL   RBC 4.16 3.87 - 5.11 MIL/uL   Hemoglobin 10.0 (L) 12.0 - 15.0 g/dL   HCT 09.831.9 (L) 11.936.0 - 14.746.0 %   MCV 76.7 (L) 80.0 - 100.0 fL   MCH 24.0 (L) 26.0 - 34.0 pg   MCHC 31.3 30.0 - 36.0 g/dL   RDW 82.915.0 56.211.5 - 13.015.5 %   Platelets 138 (L) 150 - 400 K/uL   nRBC 0.0 0.0 - 0.2 %  Comprehensive metabolic panel   Collection Time: 12/20/18  3:12 PM  Result Value Ref Range   Sodium 131 (L) 135 - 145 mmol/L   Potassium 3.5 3.5 - 5.1 mmol/L   Chloride 102 98 - 111 mmol/L   CO2 20 (L) 22 - 32 mmol/L   Glucose, Bld 89 70 - 99 mg/dL   BUN 7 6 - 20 mg/dL   Creatinine, Ser 8.650.48 0.44 - 1.00 mg/dL   Calcium 8.4 (L) 8.9 - 10.3 mg/dL   Total Protein 6.8 6.5 - 8.1 g/dL   Albumin 2.9 (L) 3.5 - 5.0 g/dL   AST 56 (H) 15 - 41 U/L   ALT 71 (H) 0 - 44 U/L   Alkaline Phosphatase 207 (H) 38 - 126 U/L   Total Bilirubin 0.5 0.3 - 1.2 mg/dL    GFR calc non Af Amer >60 >60 mL/min   GFR calc Af Amer >60 >60 mL/min   Anion gap 9 5 -  15  Type and screen [facility] [place]   Collection Time: 12/20/18  3:12 PM  Result Value Ref Range   ABO/RH(D) O POS    Antibody Screen NEG    Sample Expiration      12/23/2018 Performed at [facility], [address], [place], [place] [address]   Wet prep, genital   Collection Time: 12/20/18  3:23 PM  Result Value Ref Range   Yeast Wet Prep HPF POC NONE SEEN NONE SEEN   Trich, Wet Prep NONE SEEN NONE SEEN   Clue Cells Wet Prep HPF POC PRESENT (A) NONE SEEN   WBC, Wet Prep HPF POC MANY (A) NONE SEEN   Sperm NONE SEEN     Patient Active Problem List   Diagnosis Date Noted  . Elevated liver enzymes 12/20/2018    Assessment: Nadiah Ismail is a 23 y.o. G2P1001 at [redacted]w[redacted]d here for IOL.   #Labor: IOL for elevated LFTs #Pain: Epidural? #FWB: Category 1 #ID:  Pending #MOF: Bottle #MOC:Depo #Circ:  N/A  Dollene Cleveland 12/20/2018, 6:19 PM

## 2018-12-20 NOTE — MAU Note (Signed)
Pt reports "a lot " of pelvic and back pain for the last 2 months. Just moved here and hasn't seen a doctor in the last month. Denies ROM. Some spotting off/on for a week.

## 2018-12-20 NOTE — MAU Provider Note (Signed)
History     CSN: [id number]  Arrival date and time: 12/20/18 1352   First Provider Initiated Contact with Patient 12/20/18 1443      Chief Complaint  Patient presents with  . Vaginal Bleeding  . Abdominal Pain   HPI Melissa Gay is a 23 y.o. G2P1001 at 3747w4d who presents with abdominal pain. She states she has pain in her back, abdomen, pelvis and vagina for 2 months. She also reports a headache, "squiggly" lines in her vision and right epigastric pain. She reports some spotting yesterday but none today. She denies any leaking of fluid and reports normal fetal movement. She states she just moved here a month ago from [place] and has not been seen anywhere for prenatal care here. She reports going to the ER in December and having elevated liver enzymes.   OB History    Gravida  2   Para  1   Term  1   Preterm      AB      Living  1     SAB      TAB      Ectopic      Multiple      Live Births              Past Medical History:  Diagnosis Date  . Sickle cell trait [facility](HCC)     Past Surgical History:  Procedure Laterality Date  . WISDOM TOOTH EXTRACTION      No family history on file.  Social History   Tobacco Use  . Smoking status: Never Smoker  . Smokeless tobacco: Never Used  Substance Use Topics  . Alcohol use: Not Currently  . Drug use: Never    Allergies:  Allergies  Allergen Reactions  . Citrus Rash    Medications Prior to Admission  Medication Sig Dispense Refill Last Dose  . Prenat w/o A Vit-FeFum-FePo-FA (CONCEPT OB) 130-92.4-1 MG CAPS Take 1 capsule by mouth daily. (Patient not taking: Reported on 12/04/2018) 30 capsule 12 Not Taking at Unknown time    Review of Systems  Constitutional: Negative.  Negative for fatigue and fever.  HENT: Negative.   Eyes: Positive for visual disturbance.  Respiratory: Negative.  Negative for shortness of breath.   Cardiovascular: Negative.  Negative for chest pain.  Gastrointestinal:  Positive for abdominal pain. Negative for blood in stool, constipation, diarrhea, nausea and vomiting.  Genitourinary: Positive for vaginal bleeding and vaginal pain. Negative for dysuria.  Musculoskeletal: Positive for back pain.  Neurological: Positive for headaches. Negative for dizziness.   Physical Exam   Blood pressure 115/77, pulse (!) 110, temperature 98.5 F (36.9 C), temperature source Oral, resp. rate 16, height 5\' 5"  (1.651 m), weight 24 kg, last menstrual period 04/12/2018, SpO2 98 %.  Patient Vitals for the past 24 hrs:  BP Temp Temp src Pulse Resp SpO2 Height Weight  12/20/18 1700 (!) 129/96 - - 81 - - - -  12/20/18 1646 111/78 - - 90 - - - -  12/20/18 1616 94/70 - - 75 - - - -  12/20/18 1605 112/90 - - 77 - - - -  12/20/18 1531 (!) 101/53 - - 88 - - - -  12/20/18 1515 108/76 - - 93 - - - -  12/20/18 1503 115/73 - - 92 - - - -  12/20/18 1412 115/77 98.5 F (36.9 C) Oral (!) 110 16 98 % 5\' 5"  (1.651 m) 24 kg    Physical Exam  Nursing note and vitals reviewed. Constitutional: She is oriented to person, place, and time. She appears well-developed and well-nourished. No distress.  HENT:  Head: Normocephalic.  Eyes: Pupils are equal, round, and reactive to light.  Cardiovascular: Normal rate, regular rhythm and normal heart sounds.  Respiratory: Effort normal and breath sounds normal. No respiratory distress.  GI: Soft. Bowel sounds are normal. She exhibits no distension. There is no abdominal tenderness.  Neurological: She is alert and oriented to person, place, and time.  Skin: Skin is warm and dry.  Psychiatric: She has a normal mood and affect. Her behavior is normal. Judgment and thought content normal.   Fetal Tracing:  Baseline: 120 Variability: moderate Accels: 15x15 Decels: none  Toco: none   MAU Course  Procedures Results for orders placed or performed during the hospital encounter of 12/20/18 (from the past 24 hour(s))  Urinalysis, Routine w  reflex microscopic     Status: Abnormal   Collection Time: 12/20/18  2:56 PM  Result Value Ref Range   Color, Urine YELLOW YELLOW   APPearance CLEAR CLEAR   Specific Gravity, Urine 1.016 1.005 - 1.030   pH 6.0 5.0 - 8.0   Glucose, UA NEGATIVE NEGATIVE mg/dL   Hgb urine dipstick NEGATIVE NEGATIVE   Bilirubin Urine NEGATIVE NEGATIVE   Ketones, ur NEGATIVE NEGATIVE mg/dL   Protein, ur NEGATIVE NEGATIVE mg/dL   Nitrite NEGATIVE NEGATIVE   Leukocytes, UA MODERATE (A) NEGATIVE   RBC / HPF 0-5 0 - 5 RBC/hpf   WBC, UA 0-5 0 - 5 WBC/hpf   Bacteria, UA RARE (A) NONE SEEN   Squamous Epithelial / LPF 0-5 0 - 5   Mucus PRESENT   Protein / creatinine ratio, urine     Status: None   Collection Time: 12/20/18  2:56 PM  Result Value Ref Range   Creatinine, Urine 167.00 mg/dL   Total Protein, Urine 14 mg/dL   Protein Creatinine Ratio 0.08 0.00 - 0.15 mg/mg[Cre]  Urine rapid drug screen (hosp performed)     Status: None   Collection Time: 12/20/18  2:56 PM  Result Value Ref Range   Opiates NONE DETECTED NONE DETECTED   Cocaine NONE DETECTED NONE DETECTED   Benzodiazepines NONE DETECTED NONE DETECTED   Amphetamines NONE DETECTED NONE DETECTED   Tetrahydrocannabinol NONE DETECTED NONE DETECTED   Barbiturates NONE DETECTED NONE DETECTED  CBC     Status: Abnormal   Collection Time: 12/20/18  3:12 PM  Result Value Ref Range   WBC 7.1 4.0 - 10.5 K/uL   RBC 4.16 3.87 - 5.11 MIL/uL   Hemoglobin 10.0 (L) 12.0 - 15.0 g/dL   HCT 56.431.9 (L) 33.236.0 - 95.146.0 %   MCV 76.7 (L) 80.0 - 100.0 fL   MCH 24.0 (L) 26.0 - 34.0 pg   MCHC 31.3 30.0 - 36.0 g/dL   RDW 88.415.0 16.611.5 - 06.315.5 %   Platelets 138 (L) 150 - 400 K/uL   nRBC 0.0 0.0 - 0.2 %  Comprehensive metabolic panel     Status: Abnormal   Collection Time: 12/20/18  3:12 PM  Result Value Ref Range   Sodium 131 (L) 135 - 145 mmol/L   Potassium 3.5 3.5 - 5.1 mmol/L   Chloride 102 98 - 111 mmol/L   CO2 20 (L) 22 - 32 mmol/L   Glucose, Bld 89 70 - 99 mg/dL    BUN 7 6 - 20 mg/dL   Creatinine, Ser 0.160.48 0.44 - 1.00 mg/dL   Calcium 8.4 (  L) 8.9 - 10.3 mg/dL   Total Protein 6.8 6.5 - 8.1 g/dL   Albumin 2.9 (L) 3.5 - 5.0 g/dL   AST 56 (H) 15 - 41 U/L   ALT 71 (H) 0 - 44 U/L   Alkaline Phosphatase 207 (H) 38 - 126 U/L   Total Bilirubin 0.5 0.3 - 1.2 mg/dL   GFR calc non Af Amer >60 >60 mL/min   GFR calc Af Amer >60 >60 mL/min   Anion gap 9 5 - 15  Wet prep, genital     Status: Abnormal   Collection Time: 12/20/18  3:23 PM  Result Value Ref Range   Yeast Wet Prep HPF POC NONE SEEN NONE SEEN   Trich, Wet Prep NONE SEEN NONE SEEN   Clue Cells Wet Prep HPF POC PRESENT (A) NONE SEEN   WBC, Wet Prep HPF POC MANY (A) NONE SEEN   Sperm NONE SEEN    MDM UA, UDS CBC, CMP, Protein/creat ratio Wet prep and gc/chlamydia GBS culture [place] MFM Limited- anterior placenta, cephalic presentation  Consulted with Dr. Shawnie Pons- will admit to labor and delivery for IOL  Assessment and Plan  -Elevated Liver Enzymes  -Admit to labor and delivery -Orders placed -Care turned over to labor and delivery  Rolm Bookbinder CNM 12/20/2018, 2:43 PM

## 2018-12-20 NOTE — Progress Notes (Signed)
LABOR PROGRESS NOTE  Melissa Gay is a 23 y.o. G2P1001 at 5162w4d  admitted for IOL for elevated LFTs   Subjective: Doing well, coping with cramps  Objective: BP (!) 133/98   Pulse 68   Temp 98.7 F (37.1 C) (Oral)   Resp 16   Ht 5\' 4"  (1.626 m)   Wt 69.4 kg   LMP 04/12/2018   SpO2 98%   BMI 26.26 kg/m  or  Vitals:   12/20/18 1754 12/20/18 1802 12/20/18 1926 12/20/18 2026  BP:  109/67 (!) 111/58 (!) 133/98  Pulse:  78 68   Resp:  16 16 16   Temp:  98.3 F (36.8 C) 98.7 F (37.1 C)   TempSrc:  Oral Oral   SpO2:      Weight: 69.4 kg     Height: 5\' 4"  (1.626 m)       2330 Dilation: 2 Effacement (%): 50 Cervical Position: Posterior Station: -2 Presentation: Vertex Exam by:: k fields, rn FHT: baseline rate 135, moderate varibility, positive accel, nodecel Toco: irregular contractions noted, no pattern   Labs: Lab Results  Component Value Date   WBC 7.6 12/20/2018   HGB 10.3 (L) 12/20/2018   HCT 32.5 (L) 12/20/2018   MCV 76.8 (L) 12/20/2018   PLT 141 (L) 12/20/2018    Patient Active Problem List   Diagnosis Date Noted  . Elevated liver enzymes 12/20/2018     Assessment / Plan: 23 y.o. G2P1001 at 6762w4d here for IOL for elevated LFT's   Cbc/CMP every 6 hours; AST: 56>57, ALT 71>74 GBS unknown, >37wks no treatment  Labor: cervical dilation unchanged, cervical foley placed and miso 50 mcg po given  Fetal Wellbeing:  Category 1 Pain Control:  IV pain medications Anticipated MOD:  SVD  i1/08/2019, 11:26 PM

## 2018-12-21 ENCOUNTER — Encounter (HOSPITAL_COMMUNITY): Payer: Self-pay | Admitting: *Deleted

## 2018-12-21 ENCOUNTER — Inpatient Hospital Stay (HOSPITAL_COMMUNITY): Payer: Self-pay | Admitting: Anesthesiology

## 2018-12-21 ENCOUNTER — Encounter (HOSPITAL_COMMUNITY)
Admission: AD | Disposition: A | Discharge status: Home / Self Care | Payer: Self-pay | Source: Home / Self Care | Attending: Obstetrics and Gynecology

## 2018-12-21 DIAGNOSIS — Z3A38 38 weeks gestation of pregnancy: Secondary | ICD-10-CM

## 2018-12-21 LAB — COMPREHENSIVE METABOLIC PANEL
ALT: 66 U/L — ABNORMAL HIGH (ref 0–44)
ALT: 63 U/L — ABNORMAL HIGH (ref 0–44)
ALT: 63 U/L — ABNORMAL HIGH (ref 0–44)
AST: 51 U/L — ABNORMAL HIGH (ref 15–41)
AST: 49 U/L — ABNORMAL HIGH (ref 15–41)
AST: 47 U/L — ABNORMAL HIGH (ref 15–41)
Albumin: 2.9 g/dL — ABNORMAL LOW (ref 3.5–5.0)
Albumin: 2.9 g/dL — ABNORMAL LOW (ref 3.5–5.0)
Albumin: 3 g/dL — ABNORMAL LOW (ref 3.5–5.0)
Alkaline Phosphatase: 197 U/L — ABNORMAL HIGH (ref 38–126)
Alkaline Phosphatase: 201 U/L — ABNORMAL HIGH (ref 38–126)
Alkaline Phosphatase: 227 U/L — ABNORMAL HIGH (ref 38–126)
Anion gap: 8 (ref 5–15)
Anion gap: 9 (ref 5–15)
Anion gap: 7 (ref 5–15)
BUN: 6 mg/dL (ref 6–20)
BUN: 6 mg/dL (ref 6–20)
BUN: 5 mg/dL — ABNORMAL LOW (ref 6–20)
CHLORIDE: 107 mmol/L (ref 98–111)
CO2: 23 mmol/L (ref 22–32)
CO2: 22 mmol/L (ref 22–32)
CO2: 23 mmol/L (ref 22–32)
Calcium: 8.5 mg/dL — ABNORMAL LOW (ref 8.9–10.3)
Calcium: 8.7 mg/dL — ABNORMAL LOW (ref 8.9–10.3)
Calcium: 8.6 mg/dL — ABNORMAL LOW (ref 8.9–10.3)
Chloride: 104 mmol/L (ref 98–111)
Chloride: 107 mmol/L (ref 98–111)
Creatinine, Ser: 0.68 mg/dL (ref 0.44–1.00)
Creatinine, Ser: 0.64 mg/dL (ref 0.44–1.00)
Creatinine, Ser: 0.65 mg/dL (ref 0.44–1.00)
GFR calc Af Amer: >60 mL/min (ref >60)
GFR calc Af Amer: >60 mL/min (ref >60)
GFR calc Af Amer: >60 mL/min (ref >60)
GFR calc non Af Amer: >60 mL/min (ref >60)
GFR calc non Af Amer: >60 mL/min (ref >60)
GLUCOSE: 75 mg/dL (ref 70–99)
Glucose, Bld: 72 mg/dL (ref 70–99)
Glucose, Bld: 85 mg/dL (ref 70–99)
Potassium: 3.7 mmol/L (ref 3.5–5.1)
Potassium: 4 mmol/L (ref 3.5–5.1)
Potassium: 3.6 mmol/L (ref 3.5–5.1)
SODIUM: 135 mmol/L (ref 135–145)
Sodium: 138 mmol/L (ref 135–145)
Sodium: 137 mmol/L (ref 135–145)
Total Bilirubin: 0.8 mg/dL (ref 0.3–1.2)
Total Bilirubin: 1 mg/dL (ref 0.3–1.2)
Total Bilirubin: 1 mg/dL (ref 0.3–1.2)
Total Protein: 6.9 g/dL (ref 6.5–8.1)
Total Protein: 7 g/dL (ref 6.5–8.1)
Total Protein: 7.3 g/dL (ref 6.5–8.1)

## 2018-12-21 LAB — CBC
HCT: 31.9 % — ABNORMAL LOW (ref 36.0–46.0)
HCT: 32.5 % — ABNORMAL LOW (ref 36.0–46.0)
HCT: 31.7 % — ABNORMAL LOW (ref 36.0–46.0)
HCT: 34.3 % — ABNORMAL LOW (ref 36.0–46.0)
HCT: 33.8 % — ABNORMAL LOW (ref 36.0–46.0)
Hemoglobin: 10 g/dL — ABNORMAL LOW (ref 12.0–15.0)
Hemoglobin: 10.3 g/dL — ABNORMAL LOW (ref 12.0–15.0)
Hemoglobin: 10.1 g/dL — ABNORMAL LOW (ref 12.0–15.0)
Hemoglobin: 10.7 g/dL — ABNORMAL LOW (ref 12.0–15.0)
Hemoglobin: 10.5 g/dL — ABNORMAL LOW (ref 12.0–15.0)
MCH: 24 pg — ABNORMAL LOW (ref 26.0–34.0)
MCH: 24.3 pg — ABNORMAL LOW (ref 26.0–34.0)
MCH: 24.3 pg — ABNORMAL LOW (ref 26.0–34.0)
MCH: 24.2 pg — ABNORMAL LOW (ref 26.0–34.0)
MCH: 24.2 pg — ABNORMAL LOW (ref 26.0–34.0)
MCHC: 31.3 g/dL (ref 30.0–36.0)
MCHC: 31.7 g/dL (ref 30.0–36.0)
MCHC: 31.9 g/dL (ref 30.0–36.0)
MCHC: 31.2 g/dL (ref 30.0–36.0)
MCHC: 31.1 g/dL (ref 30.0–36.0)
MCV: 76.7 fL — AB (ref 80.0–100.0)
MCV: 76.8 fL — ABNORMAL LOW (ref 80.0–100.0)
MCV: 76.4 fL — ABNORMAL LOW (ref 80.0–100.0)
MCV: 77.6 fL — ABNORMAL LOW (ref 80.0–100.0)
MCV: 78.1 fL — ABNORMAL LOW (ref 80.0–100.0)
NRBC: 0 % (ref 0.0–0.2)
PLATELETS: 134 10*3/uL — AB (ref 150–400)
Platelets: 138 10*3/uL — ABNORMAL LOW (ref 150–400)
Platelets: 141 10*3/uL — ABNORMAL LOW (ref 150–400)
Platelets: 141 10*3/uL — ABNORMAL LOW (ref 150–400)
Platelets: 119 10*3/uL — ABNORMAL LOW (ref 150–400)
RBC: 4.16 MIL/uL (ref 3.87–5.11)
RBC: 4.23 MIL/uL (ref 3.87–5.11)
RBC: 4.15 MIL/uL (ref 3.87–5.11)
RBC: 4.42 MIL/uL (ref 3.87–5.11)
RBC: 4.33 MIL/uL (ref 3.87–5.11)
RDW: 15 % (ref 11.5–15.5)
RDW: 15 % (ref 11.5–15.5)
RDW: 15 % (ref 11.5–15.5)
RDW: 15.1 % (ref 11.5–15.5)
RDW: 15.1 % (ref 11.5–15.5)
WBC: 7.1 10*3/uL (ref 4.0–10.5)
WBC: 7.6 10*3/uL (ref 4.0–10.5)
WBC: 6.7 10*3/uL (ref 4.0–10.5)
WBC: 9.4 10*3/uL (ref 4.0–10.5)
WBC: 9 10*3/uL (ref 4.0–10.5)
nRBC: 0 % (ref 0.0–0.2)
nRBC: 0 % (ref 0.0–0.2)
nRBC: 0 % (ref 0.0–0.2)
nRBC: 0 % (ref 0.0–0.2)

## 2018-12-21 LAB — RPR, QUANT+TP ABS (REFLEX)
Rapid Plasma Reagin, Quant: 1:1 {titer} — ABNORMAL HIGH
T Pallidum Abs: NONREACTIVE

## 2018-12-21 LAB — RAPID HIV SCREEN (HIV 1/2 AB+AG)
HIV 1/2 ANTIBODIES: NONREACTIVE
HIV-1 P24 Antigen - HIV24: NONREACTIVE

## 2018-12-21 LAB — LIPASE, BLOOD: Lipase: 23 U/L (ref 11–51)

## 2018-12-21 LAB — AMYLASE: Amylase: 78 U/L (ref 28–100)

## 2018-12-21 LAB — CULTURE, OB URINE: CULTURE: NO GROWTH

## 2018-12-21 LAB — GC/CHLAMYDIA PROBE AMP (~~LOC~~) NOT AT ARMC
Chlamydia: POSITIVE — AB
NEISSERIA GONORRHEA: NEGATIVE

## 2018-12-21 LAB — RPR: RPR Ser Ql: REACTIVE — AB

## 2018-12-21 SURGERY — Surgical Case
Anesthesia: Regional

## 2018-12-21 MED ORDER — EPHEDRINE 5 MG/ML INJ: 10.0000 mg | INTRAVENOUS | Status: DC | PRN

## 2018-12-21 MED ORDER — PHENYLEPHRINE 40 MCG/ML (10ML) SYRINGE FOR IV PUSH (FOR BLOOD PRESSURE SUPPORT)
80.0000 ug | PREFILLED_SYRINGE | INTRAVENOUS | Status: DC | PRN
  Filled 2018-12-21 (×2): qty 10

## 2018-12-21 MED ORDER — PHENYLEPHRINE 40 MCG/ML (10ML) SYRINGE FOR IV PUSH (FOR BLOOD PRESSURE SUPPORT): 80.0000 ug | PREFILLED_SYRINGE | INTRAVENOUS | Status: DC | PRN

## 2018-12-21 MED ORDER — LACTATED RINGERS IV SOLN: 500.0000 mL | Freq: Once | INTRAVENOUS | Status: DC

## 2018-12-21 MED ORDER — ONDANSETRON HCL 4 MG PO TABS: 4.0000 mg | ORAL_TABLET | ORAL | Status: DC | PRN

## 2018-12-21 MED ORDER — MAGNESIUM HYDROXIDE 400 MG/5ML PO SUSP: 30.0000 mL | ORAL | Status: DC | PRN

## 2018-12-21 MED ORDER — TERBUTALINE SULFATE 1 MG/ML IJ SOLN
0.2500 mg | Freq: Once | INTRAMUSCULAR | Status: DC | PRN
  Filled 2018-12-21: qty 1

## 2018-12-21 MED ORDER — SIMETHICONE 80 MG PO CHEW: 80.0000 mg | CHEWABLE_TABLET | ORAL | Status: DC | PRN

## 2018-12-21 MED ORDER — DIPHENHYDRAMINE HCL 50 MG/ML IJ SOLN: 12.5000 mg | INTRAMUSCULAR | Status: DC | PRN

## 2018-12-21 MED ORDER — ONDANSETRON HCL 4 MG/2ML IJ SOLN: 4.0000 mg | INTRAMUSCULAR | Status: DC | PRN

## 2018-12-21 MED ORDER — SODIUM CHLORIDE 0.9 % IV SOLN
5.0000 10*6.[IU] | Freq: Once | INTRAVENOUS | Status: AC
  Administered 2018-12-21: 5 10*6.[IU] via INTRAVENOUS
  Filled 2018-12-21: qty 5

## 2018-12-21 MED ORDER — FENTANYL 2.5 MCG/ML BUPIVACAINE 1/10 % EPIDURAL INFUSION (WH - ANES): 14.0000 mL/h | INTRAMUSCULAR | Status: DC | PRN

## 2018-12-21 MED ORDER — PRENATAL MULTIVITAMIN CH
1.0000 | ORAL_TABLET | Freq: Every day | ORAL | Status: DC
  Administered 2018-12-22 – 2018-12-23 (×2): 1 via ORAL
  Filled 2018-12-21 (×2): qty 1

## 2018-12-21 MED ORDER — BENZOCAINE-MENTHOL 20-0.5 % EX AERO: 1.0000 "application " | INHALATION_SPRAY | CUTANEOUS | Status: DC | PRN

## 2018-12-21 MED ORDER — EPHEDRINE 5 MG/ML INJ
10.0000 mg | INTRAVENOUS | Status: DC | PRN
  Filled 2018-12-21: qty 2

## 2018-12-21 MED ORDER — FENTANYL 2.5 MCG/ML BUPIVACAINE 1/10 % EPIDURAL INFUSION (WH - ANES)
14.0000 mL/h | INTRAMUSCULAR | Status: DC | PRN
  Administered 2018-12-21 (×2): 14 mL/h via EPIDURAL
  Filled 2018-12-21 (×2): qty 100

## 2018-12-21 MED ORDER — OXYTOCIN 40 UNITS IN NORMAL SALINE INFUSION - SIMPLE MED: 1.0000 m[IU]/min | INTRAVENOUS | Status: DC

## 2018-12-21 MED ORDER — FERROUS SULFATE 325 (65 FE) MG PO TABS
325.0000 mg | ORAL_TABLET | Freq: Two times a day (BID) | ORAL | Status: DC
  Administered 2018-12-22 – 2018-12-23 (×3): 325 mg via ORAL
  Filled 2018-12-21 (×3): qty 1

## 2018-12-21 MED ORDER — WITCH HAZEL-GLYCERIN EX PADS: 1.0000 "application " | MEDICATED_PAD | CUTANEOUS | Status: DC | PRN

## 2018-12-21 MED ORDER — PENICILLIN G 3 MILLION UNITS IVPB - SIMPLE MED
3.0000 10*6.[IU] | INTRAVENOUS | Status: DC
  Administered 2018-12-21: 3 10*6.[IU] via INTRAVENOUS
  Filled 2018-12-21 (×3): qty 100

## 2018-12-21 MED ORDER — ACETAMINOPHEN 325 MG PO TABS: 650.0000 mg | ORAL_TABLET | ORAL | Status: DC | PRN

## 2018-12-21 MED ORDER — IBUPROFEN 600 MG PO TABS
600.0000 mg | ORAL_TABLET | Freq: Four times a day (QID) | ORAL | Status: DC
  Administered 2018-12-21 – 2018-12-23 (×8): 600 mg via ORAL
  Filled 2018-12-21 (×8): qty 1

## 2018-12-21 MED ORDER — LIDOCAINE HCL (PF) 1 % IJ SOLN
INTRAMUSCULAR | Status: DC | PRN
  Administered 2018-12-21: 8 mL via EPIDURAL

## 2018-12-21 MED ORDER — TETANUS-DIPHTH-ACELL PERTUSSIS 5-2.5-18.5 LF-MCG/0.5 IM SUSP: 0.5000 mL | Freq: Once | INTRAMUSCULAR | Status: DC

## 2018-12-21 MED ORDER — DIBUCAINE 1 % RE OINT: 1.0000 "application " | TOPICAL_OINTMENT | RECTAL | Status: DC | PRN

## 2018-12-21 MED ORDER — MEASLES, MUMPS & RUBELLA VAC IJ SOLR: 0.5000 mL | Freq: Once | INTRAMUSCULAR | Status: DC

## 2018-12-21 MED ORDER — DIPHENHYDRAMINE HCL 25 MG PO CAPS: 25.0000 mg | ORAL_CAPSULE | Freq: Four times a day (QID) | ORAL | Status: DC | PRN

## 2018-12-21 MED ORDER — COCONUT OIL OIL: 1.0000 "application " | TOPICAL_OIL | Status: DC | PRN

## 2018-12-21 MED ORDER — OXYTOCIN 40 UNITS IN NORMAL SALINE INFUSION - SIMPLE MED
1.0000 m[IU]/min | INTRAVENOUS | Status: DC
  Administered 2018-12-21: 2 m[IU]/min via INTRAVENOUS

## 2018-12-21 NOTE — Anesthesia Postprocedure Evaluation (Signed)
Anesthesia Post Note  Patient: Melissa Gay  Procedure(s) Performed: AN AD HOC LABOR EPIDURAL     Patient location during evaluation: Mother Baby Anesthesia Type: Epidural Level of consciousness: awake and alert Pain management: pain level controlled Vital Signs Assessment: post-procedure vital signs reviewed and stable Respiratory status: spontaneous breathing, nonlabored ventilation and respiratory function stable Cardiovascular status: stable Postop Assessment: no headache, no backache and epidural receding Anesthetic complications: no    Last Vitals:  Vitals:   12/21/18 1647 12/21/18 1701  BP: 99/64 118/77  Pulse: 75   Resp: 16 16  Temp:    SpO2:      Last Pain:  Vitals:   12/21/18 1701  TempSrc:   PainSc: 0-No pain   Pain Goal:                 Natilee Gauer

## 2018-12-21 NOTE — Addendum Note (Signed)
Addendum  created 12/21/18 2013 by Orlie Pollen, CRNA   Clinical Note Signed

## 2018-12-21 NOTE — Progress Notes (Addendum)
Viera Ulonda Polin is a 23 y.o. G2P1001 at [redacted]w[redacted]d for IOL for elevated LFTs  Subjective: Doing well with no issues. Pain well controlled.  Objective: BP 116/70   Pulse 63   Temp 97.8 F (36.6 C) (Oral)   Resp 16   Ht 5\' 4"  (1.626 m)   Wt 69.4 kg   LMP 04/12/2018   SpO2 99%   BMI 26.26 kg/m  No intake/output data recorded. No intake/output data recorded.  FHT:  FHR: 140 bpm, variability: moderate,  accelerations:  Present,  decelerations:  Absent UC:   regular, every 2-3 minutes SVE:   Dilation: 6.5 Effacement (%): 60 Station: 0 Exam by:: [employer] CNM  Labs: Lab Results  Component Value Date   WBC 6.7 12/21/2018   HGB 10.1 (L) 12/21/2018   HCT 31.7 (L) 12/21/2018   MCV 76.4 (L) 12/21/2018   PLT 141 (L) 12/21/2018    Assessment / Plan: Hepatitis panel and CMP pending. Will have adequate gbs ppx at 1500. Will likely recheck and AROM at that time.  Labor: progressing on pitocin. S/P cytotec x1 and FB. Will likely AROM at 1500 when adequate GBS ppx. Preeclampsia:  N/A Fetal Wellbeing:  Category I Pain Control:  Epidural I/D:  pcn, adequate at 1500. Follow up hep panel and cmp Anticipated MOD:  NSVD  Myrene Buddy 12/21/2018, 2:01 PM

## 2018-12-21 NOTE — Anesthesia Procedure Notes (Signed)
Epidural Patient location during procedure: OB Start time: 12/21/2018 7:27 AM End time: 12/21/2018 7:35 AM  Staffing Anesthesiologist: Bethena Midget, MD  Preanesthetic Checklist Completed: patient identified, site marked, surgical consent, pre-op evaluation, timeout performed, IV checked, risks and benefits discussed and monitors and equipment checked  Epidural Patient position: sitting Prep: site prepped and draped and DuraPrep Patient monitoring: continuous pulse ox and blood pressure Approach: midline Location: L3-L4 Injection technique: LOR air  Needle:  Needle type: Tuohy  Needle gauge: 17 G Needle length: 9 cm and 9 Needle insertion depth: 5 cm Catheter type: closed end flexible Catheter size: 19 Gauge Catheter at skin depth: 11 cm Test dose: negative  Assessment Events: blood not aspirated, injection not painful, no injection resistance, negative IV test and no paresthesia

## 2018-12-21 NOTE — Discharge Summary (Signed)
Postpartum Discharge Summary     Patient Name: Melissa Gay DOB: 1996-09-09 MRN: [id number]  Date of admission: 12/20/2018 Delivering Provider: Calvert Cantor   Date of discharge: 12/23/2018  Admitting diagnosis: 40WKS VAGINAL BLEEDING PELVIC AND BK PN Intrauterine pregnancy: [redacted]w[redacted]d     Secondary diagnosis:  Active Problems:   Elevated liver enzymes  Additional problems: N/A     Discharge diagnosis: Term Pregnancy Delivered                                                                                                Post partum procedures:N/A  Augmentation: AROM, Pitocin, Cytotec and [place] Balloon  Complications: None  Hospital course:  Induction of Labor With Vaginal Delivery   23 y.o. yo [id number] at [redacted]w[redacted]d was admitted to the hospital 12/20/2018 for induction of labor.  Indication for induction: elevated liver function tests, low platelets.  Patient had an uncomplicated labor course as follows: Membrane Rupture Time/Date: 3:03 PM ,12/21/2018   Intrapartum Procedures: Episiotomy: None [1]                                         Lacerations:  None [1]  Patient had delivery of a Viable infant.  Information for the patient's newborn:  Morrison, Moerman [[id number]]  Delivery Method: Vaginal, Spontaneous(Filed from Delivery Summary)   12/21/2018  Details of delivery can be found in separate delivery note.  Patient had a routine postpartum course. Patient is discharged home 12/23/18.  Magnesium Sulfate recieved: No BMZ received: No  Physical exam  Vitals:   12/22/18 0840 12/22/18 1419 12/22/18 2257 12/23/18 0530  BP: 121/65 97/73 121/70 113/84  Pulse: 64 76 69 74  Resp: 18 18 18 16   Temp: 98.5 F (36.9 C) 98.5 F (36.9 C) 98.1 F (36.7 C) 97.6 F (36.4 C)  TempSrc: Oral Oral Oral Oral  SpO2: 99%   99%  Weight:      Height:       General: alert, cooperative and no distress Lochia: appropriate Uterine Fundus: firm Incision: N/A DVT Evaluation: No  evidence of DVT seen on physical exam. Labs: Lab Results  Component Value Date   WBC 9.0 12/21/2018   HGB 10.5 (L) 12/21/2018   HCT 33.8 (L) 12/21/2018   MCV 78.1 (L) 12/21/2018   PLT 134 (L) 12/21/2018   CMP Latest Ref Rng & Units 12/21/2018  Glucose 70 - 99 mg/dL 85  BUN 6 - 20 mg/dL 5(L)  Creatinine 4.85 - 1.00 mg/dL 4.62  Sodium 703 - 500 mmol/L 137  Potassium 3.5 - 5.1 mmol/L 3.6  Chloride 98 - 111 mmol/L 107  CO2 22 - 32 mmol/L 23  Calcium 8.9 - 10.3 mg/dL 9.3(G)  Total Protein 6.5 - 8.1 g/dL 7.3  Total Bilirubin 0.3 - 1.2 mg/dL 1.0  Alkaline Phos 38 - 126 U/L 227(H)  AST 15 - 41 U/L 47(H)  ALT 0 - 44 U/L 63(H)    Discharge instruction: per After Visit Summary and "Baby and  Me Booklet".  After visit meds:  Allergies as of 12/23/2018      Reactions   Acetaminophen Hives   Citrus Rash   Codeine Rash      Medication List    TAKE these medications   CONCEPT OB 130-92.4-1 MG Caps Take 1 capsule by mouth daily.   ibuprofen 600 MG tablet Commonly known as:  ADVIL,MOTRIN Take 1 tablet (600 mg total) by mouth every 6 (six) hours.       Diet: routine diet  Activity: Advance as tolerated. Pelvic rest for 6 weeks.   Outpatient follow up:4 weeks Follow up Appt:No future appointments. Follow up Visit:   Please schedule this patient for Postpartum visit in: 4 weeks with the following provider: Any provider For C/S patients schedule nurse incision check in weeks 2 weeks: no Low risk pregnancy complicated by: Mildly elevated liver enzymes and low platelets with normal blood pressures. Delivery mode:  CS Anticipated Birth Control:  Depo PP Procedures needed: None  Schedule Integrated BH visit: no  Newborn Data: Live born female  Birth Weight: 6 lb 8.2 oz (2955 g) APGAR: 9, 9  Newborn Delivery   Birth date/time:  12/21/2018 16:33:00 Delivery type:  Vaginal, Spontaneous     Baby Feeding: Bottle Disposition:home with mother   12/23/2018 Gwenevere Abbot,  MD

## 2018-12-21 NOTE — Progress Notes (Signed)
Melissa Gay is a 23 y.o. G2P1001 at [redacted]w[redacted]d by LMP admitted for induction of labor due to elevated LFTs.  Subjective: Comfortable with epidural in place. Denies pain, pressure.  Objective: BP 118/77   Pulse 67   Temp 97.8 F (36.6 C) (Oral)   Resp 16   Ht 5\' 4"  (1.626 m)   Wt 69.4 kg   LMP 04/12/2018   SpO2 99%   BMI 26.26 kg/m  No intake/output data recorded. No intake/output data recorded.  FHT:  FHR: 125 bpm, variability: moderate,  accelerations:  Present,  decelerations:  Absent UC:   irregular, every 2-3 minutes SVE:   Dilation: 6.5 Effacement (%): 60 Station: 0 Exam by:: [employer] CNM  Labs: Lab Results  Component Value Date   WBC 9.4 12/21/2018   HGB 10.7 (L) 12/21/2018   HCT 34.3 (L) 12/21/2018   MCV 77.6 (L) 12/21/2018   PLT 119 (L) 12/21/2018    Assessment / Plan: Induction of labor due to elevated LFTs,  progressing well on pitocin  AROM now (1505), IUPC placed for improved Pitocin titration Labor: Progressing normally Fetal Wellbeing:  Category I Pain Control:  Epidural I/D:  n/a Anticipated MOD:  NSVD  Calvert Cantor, CNM 12/21/2018, 3:10 PM

## 2018-12-21 NOTE — Anesthesia Pain Management Evaluation Note (Signed)
  CRNA Pain Management Visit Note  Patient: Melissa Gay, 23 y.o., female  "Hello I am a member of the anesthesia team at [facility]. We have an anesthesia team available at all times to provide care throughout the hospital, including epidural management and anesthesia for C-section. I don't know your plan for the delivery whether it a natural birth, water birth, IV sedation, nitrous supplementation, doula or epidural, but we want to meet your pain goals."   1.Was your pain managed to your expectations on prior hospitalizations?   Yes   2.What is your expectation for pain management during this hospitalization?     Epidural  3.How can we help you reach that goal? Support PRN  Record the patient's initial score and the patient's pain goal.   Pain: 0 - epidural already in place and working well   Pain Goal: 7 [facility] wants you to be able to say your pain was always managed very well.  Jennelle Human 12/21/2018

## 2018-12-21 NOTE — Anesthesia Postprocedure Evaluation (Signed)
Anesthesia Post Note  Patient: Melissa Gay  Procedure(s) Performed: AN AD HOC LABOR EPIDURAL     Patient location during evaluation: Mother Baby Anesthesia Type: Epidural Level of consciousness: awake and alert Pain management: pain level controlled Vital Signs Assessment: post-procedure vital signs reviewed and stable Respiratory status: spontaneous breathing, nonlabored ventilation and respiratory function stable Cardiovascular status: stable Postop Assessment: no headache, no backache and epidural receding Anesthetic complications: no    Last Vitals:  Vitals:   12/21/18 1820 12/21/18 1915  BP: 103/65 107/73  Pulse: 62 60  Resp: 18   Temp: 36.8 C 36.7 C  SpO2:  100%    Last Pain:  Vitals:   12/21/18 1915  TempSrc: Oral  PainSc:    Pain Goal:                 [employer]

## 2018-12-21 NOTE — Progress Notes (Addendum)
Melissa Gay is a 23 y.o. G2P1001 at [redacted]w[redacted]d by LMP admitted for induction of labor due to elevated LFTs.  Subjective: Patient is intermittently vomiting. Consents to GBS prophylaxis. Declining all labwork  Objective: BP 106/60   Pulse 66   Temp 98.4 F (36.9 C) (Oral)   Resp 16   Ht 5\' 4"  (1.626 m)   Wt 69.4 kg   LMP 04/12/2018   SpO2 99%   BMI 26.26 kg/m  No intake/output data recorded. No intake/output data recorded.  FHT:  FHR: 135 bpm, variability: moderate,  accelerations:  Present,  decelerations:  Absent UC:   irregular, every 2-4 minutes SVE:   Dilation: 6.5 Effacement (%): 60 Station: 0 Exam by:: [employer] CNM  Labs: Lab Results  Component Value Date   WBC 6.7 12/21/2018   HGB 10.1 (L) 12/21/2018   HCT 31.7 (L) 12/21/2018   MCV 76.4 (L) 12/21/2018   PLT 141 (L) 12/21/2018    Assessment / Plan: Induction of labor due to elevated LFTs,  progressing well on pitocin Initiate GBS prophylaxis as advised by Dr. Vergie Living AROM after adequate prophylaxis Category I tracing Epidural in place Anticipate NSVD  Calvert Cantor 12/21/2018, 1030 am

## 2018-12-21 NOTE — Progress Notes (Signed)
LABOR PROGRESS NOTE  Melissa Gay is a 23 y.o. G2P1001 at [redacted]w[redacted]d  admitted for IOL for elevated LFTs   Subjective: Doing well, coping with contractions, pt desires to wait until she is 6cm for epidural   Objective: BP 112/64   Pulse 66   Temp 98.9 F (37.2 C) (Oral)   Resp 16   Ht 5\' 4"  (1.626 m)   Wt 69.4 kg   LMP 04/12/2018   SpO2 100%   BMI 26.26 kg/m  or  Vitals:   12/20/18 2329 12/21/18 0111 12/21/18 0208 12/21/18 0301  BP: 127/65 (!) 93/58 (!) 112/49 112/64  Pulse: (!) 125 70 70 66  Resp: 18 16 16 16   Temp: 98.9 F (37.2 C)     TempSrc: Oral     SpO2:      Weight:      Height:        0400  Cervical dilation   FHT: baseline rate 130, moderate varibility, +accel, no decel Toco: every 2-4 minutes lasting 50-60 seconds   Labs: Lab Results  Component Value Date   WBC 6.7 12/21/2018   HGB 10.1 (L) 12/21/2018   HCT 31.7 (L) 12/21/2018   MCV 76.4 (L) 12/21/2018   PLT 141 (L) 12/21/2018    Patient Active Problem List   Diagnosis Date Noted  . Elevated liver enzymes 12/20/2018    Assessment / Plan: 23 y.o. G2P1001 at 105w5d here for IOL for elevated LFT's  0300 labs stable: AST-51, ALT-66, plt-141 Labor: cervical foley out, plan is to start pitocin and titrate as needed Fetal Wellbeing:  Category 1  Pain Control:  IV pain medications, pt coping well with contractions currently, plans to get epidural once she gets to 6cm  Anticipated MOD:  SVD  12/21/2018, 4:21 AM

## 2018-12-21 NOTE — Anesthesia Preprocedure Evaluation (Signed)
Anesthesia Evaluation  Patient identified by MRN, date of birth, ID band Patient awake    Reviewed: Allergy & Precautions, H&P , NPO status , Patient's Chart, lab work & pertinent test results, reviewed documented beta blocker date and time   Airway Mallampati: II  TM Distance: >3 FB Neck ROM: full    Dental no notable dental hx.    Pulmonary neg pulmonary ROS,    Pulmonary exam normal breath sounds clear to auscultation       Cardiovascular hypertension, Normal cardiovascular exam Rhythm:regular Rate:Normal     Neuro/Psych negative neurological ROS  negative psych ROS   GI/Hepatic negative GI ROS, Neg liver ROS,   Endo/Other  negative endocrine ROS  Renal/GU negative Renal ROS  negative genitourinary   Musculoskeletal   Abdominal   Peds  Hematology negative hematology ROS (+)   Anesthesia Other Findings   Reproductive/Obstetrics (+) Pregnancy                             Anesthesia Physical Anesthesia Plan  ASA: III  Anesthesia Plan: Epidural   Post-op Pain Management:    Induction:   PONV Risk Score and Plan: 2  Airway Management Planned:   Additional Equipment:   Intra-op Plan:   Post-operative Plan:   Informed Consent: I have reviewed the patients History and Physical, chart, labs and discussed the procedure including the risks, benefits and alternatives for the proposed anesthesia with the patient or authorized representative who has indicated his/her understanding and acceptance.     Plan Discussed with: Anesthesiologist, Surgeon and CRNA  Anesthesia Plan Comments:         Anesthesia Quick Evaluation

## 2018-12-22 LAB — RUBELLA ANTIBODY, IGM: Rubella IgM: <20 AU/mL (ref 0.0–19.9)

## 2018-12-22 LAB — HEPATITIS PANEL, ACUTE
HCV Ab: <0.1 s/co ratio (ref 0.0–0.9)
Hep A IgM: NEGATIVE
Hep A IgM: NEGATIVE
Hep B C IgM: NEGATIVE
Hep B C IgM: NEGATIVE
Hepatitis B Surface Ag: NEGATIVE
Hepatitis B Surface Ag: NEGATIVE

## 2018-12-22 LAB — HEPATITIS B SURFACE ANTIGEN: Hepatitis B Surface Ag: NEGATIVE

## 2018-12-22 LAB — CULTURE, BETA STREP (GROUP B ONLY)

## 2018-12-22 LAB — HIV ANTIBODY (ROUTINE TESTING W REFLEX): HIV Screen 4th Generation wRfx: NONREACTIVE

## 2018-12-22 NOTE — Progress Notes (Signed)
Post Partum Day 1 s/p SVD after being induced for elevated LFT's  Subjective: no complaints, doing well, reports +voids, reports cramping is well managed with ibuprofen    Objective: Blood pressure (!) 105/56, pulse 82, temperature 98.7 F (37.1 C), temperature source Oral, resp. rate 18, height 5\' 4"  (1.626 m), weight 69.4 kg, last menstrual period 04/12/2018, SpO2 100 %, unknown if currently breastfeeding.  Physical Exam:  General: alert Lochia: appropriate Uterine Fundus: firm Incision: n/a DVT Evaluation: No evidence of DVT seen on physical exam. + voiding Ambulating without difficulty  Recent Labs    12/21/18 1333 12/21/18 1803  HGB 10.7* 10.5*  HCT 34.3* 33.8*    Assessment/Plan:  PPD#1 s/p SVD Doing well, recovering as expected  Bottle feeding Hepatitis panel still in process  Anticipate discharge tomorrow     LOS: 2 days   Sigurd Sos Boris Engelmann 12/22/2018, 7:32 AM
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
## 2018-12-23 MED ORDER — IBUPROFEN 600 MG PO TABS: 600.0000 mg | ORAL_TABLET | Freq: Four times a day (QID) | ORAL | 0 refills | Status: AC
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
## 2018-12-24 ENCOUNTER — Telehealth (HOSPITAL_COMMUNITY): Payer: Self-pay | Admitting: Lactation Services

## 2018-12-24 NOTE — Telephone Encounter (Signed)
Called mom at request of Shelda Pal after follow up phone call post discharge. Mom is formula feeding and is engorged. Mom reports she wants to dry up her milk. Mom has been trying heat and expression in the shower.   Enc mom to try ice packs with a layer in between the ice pack and the breast and/or crushed cabbage leaves inside her bra several times a day until leaves are wilted. Enc mom to wear supportive bra and can take Tylenol or Ibuprofen if not allergic to it.   Enc mom to call with further questions as needed.

## 2019-04-18 DIAGNOSIS — R51 Headache: Secondary | ICD-10-CM

## 2019-04-18 DIAGNOSIS — H9201 Otalgia, right ear: Secondary | ICD-10-CM

## 2019-04-18 DIAGNOSIS — Z91018 Allergy to other foods: Secondary | ICD-10-CM

## 2019-04-18 DIAGNOSIS — Z885 Allergy status to narcotic agent status: Secondary | ICD-10-CM

## 2019-04-18 DIAGNOSIS — G43909 Migraine, unspecified, not intractable, without status migrainosus: Principal | ICD-10-CM

## 2019-04-18 DIAGNOSIS — K0889 Other specified disorders of teeth and supporting structures: Secondary | ICD-10-CM

## 2019-04-18 DIAGNOSIS — Z886 Allergy status to analgesic agent status: Secondary | ICD-10-CM

## 2019-04-18 MED ORDER — PROCHLORPERAZINE EDISYLATE 10 MG/2ML IJ SOLN
10 mg | Freq: Once | INTRAVENOUS | Status: CP
Start: 2019-04-18 — End: ?

## 2019-04-18 MED ORDER — PROCHLORPERAZINE MALEATE 10 MG PO TABS
10 mg | Freq: Four times a day (QID) | ORAL | 0 refills | 10.00000 days | Status: CP | PRN
Start: 2019-04-18 — End: ?

## 2019-04-18 MED ORDER — KETOROLAC TROMETHAMINE 30 MG/ML IJ SOLN
30 mg | Freq: Once | INTRAVENOUS | Status: CP
Start: 2019-04-18 — End: ?

## 2019-04-18 MED ORDER — DIPHENHYDRAMINE HCL 50 MG/ML IJ SOLN
25 mg | Freq: Once | INTRAVENOUS | Status: CP
Start: 2019-04-18 — End: ?

## 2019-04-18 MED ORDER — BOLUS IV FLUID JX
Freq: Once | INTRAVENOUS | Status: CP
Start: 2019-04-18 — End: ?

## 2019-06-09 ENCOUNTER — Inpatient Hospital Stay: Admit: 2019-06-09 | Discharge: 2019-06-09

## 2019-06-09 DIAGNOSIS — R51 Headache: Secondary | ICD-10-CM

## 2019-06-09 DIAGNOSIS — R220 Localized swelling, mass and lump, head: Principal | ICD-10-CM

## 2019-06-09 MED ORDER — AMOXICILLIN-POT CLAVULANATE 875-125 MG PO TABS
1 | ORAL_TABLET | Freq: Two times a day (BID) | ORAL | 0 refills | Status: CP
Start: 2019-06-09 — End: ?

## 2019-06-09 MED ORDER — AMOXICILLIN-POT CLAVULANATE 875-125 MG PO TABS
1 | ORAL_TABLET | Freq: Once | ORAL | Status: CP
Start: 2019-06-09 — End: ?

## 2019-11-22 ENCOUNTER — Inpatient Hospital Stay: Admit: 2019-11-22 | Discharge: 2019-11-23 | Payer: MEDICAID

## 2019-11-23 ENCOUNTER — Emergency Department: Admit: 2019-11-23 | Discharge: 2019-11-23 | Payer: MEDICAID

## 2019-11-23 ENCOUNTER — Inpatient Hospital Stay: Admit: 2019-11-23 | Discharge: 2019-11-23 | Payer: MEDICAID

## 2019-11-23 DIAGNOSIS — M79672 Pain in left foot: Secondary | ICD-10-CM

## 2019-11-23 DIAGNOSIS — M25562 Pain in left knee: Secondary | ICD-10-CM

## 2019-11-23 DIAGNOSIS — M25512 Pain in left shoulder: Secondary | ICD-10-CM

## 2019-11-23 MED ORDER — CYCLOBENZAPRINE HCL 5 MG PO TABS
5 mg | Freq: Three times a day (TID) | ORAL | Status: DC
Start: 2019-11-23 — End: 2019-11-24

## 2019-11-23 MED ORDER — CYCLOBENZAPRINE HCL 5 MG PO TABS
5 mg | Freq: Three times a day (TID) | ORAL | 0 refills | Status: CP | PRN
Start: 2019-11-23 — End: ?

## 2020-01-23 DIAGNOSIS — R112 Nausea with vomiting, unspecified: Secondary | ICD-10-CM

## 2020-01-23 DIAGNOSIS — K5900 Constipation, unspecified: Secondary | ICD-10-CM

## 2020-01-23 DIAGNOSIS — N390 Urinary tract infection, site not specified: Secondary | ICD-10-CM

## 2020-01-23 DIAGNOSIS — R102 Pelvic and perineal pain: Principal | ICD-10-CM

## 2020-01-23 MED ORDER — KETOROLAC TROMETHAMINE 30 MG/ML IJ SOLN
30 mg | Freq: Once | INTRAMUSCULAR | Status: DC
Start: 2020-01-23 — End: 2020-01-24

## 2020-01-23 MED ORDER — DOCUSATE SODIUM 100 MG PO CAPS
100 mg | Freq: Two times a day (BID) | ORAL | 0 refills | Status: CP
Start: 2020-01-23 — End: ?

## 2020-01-23 MED ORDER — ONDANSETRON 4 MG PO TBDP
4 mg | Freq: Three times a day (TID) | ORAL | 0 refills | Status: CP | PRN
Start: 2020-01-23 — End: ?

## 2020-01-23 MED ORDER — ONDANSETRON 4 MG PO TBDP
4 mg | Freq: Once | ORAL | Status: CP
Start: 2020-01-23 — End: ?

## 2020-01-23 MED ORDER — DOCUSATE SODIUM 100 MG PO CAPS
100 mg | Freq: Once | ORAL | Status: CP
Start: 2020-01-23 — End: ?

## 2020-01-23 MED ORDER — NITROFURANTOIN MONOHYD MACRO 100 MG PO CAPS
100 mg | Freq: Two times a day (BID) | ORAL | 0 refills | 7.00 days | Status: CP
Start: 2020-01-23 — End: ?

## 2020-01-24 ENCOUNTER — Inpatient Hospital Stay: Admit: 2020-01-24 | Discharge: 2020-01-24 | Payer: MEDICAID

## 2020-04-21 ENCOUNTER — Emergency Department: Admit: 2020-04-21 | Discharge: 2020-04-21

## 2020-04-21 DIAGNOSIS — K0889 Other specified disorders of teeth and supporting structures: Secondary | ICD-10-CM

## 2020-04-21 DIAGNOSIS — R519 Facial pain: Secondary | ICD-10-CM

## 2020-04-21 DIAGNOSIS — S025XXA Fracture of tooth (traumatic), initial encounter for closed fracture: Principal | ICD-10-CM

## 2020-04-21 MED ORDER — PENICILLIN V POTASSIUM 250 MG PO TABS
500 mg | Freq: Once | ORAL | Status: CP
Start: 2020-04-21 — End: ?

## 2020-04-21 MED ORDER — IBUPROFEN 400 MG PO TABS
800 mg | Freq: Once | ORAL | Status: CP
Start: 2020-04-21 — End: ?

## 2020-04-21 MED ORDER — PENICILLIN V POTASSIUM 500 MG PO TABS
500 mg | Freq: Four times a day (QID) | ORAL | 0 refills | Status: CP
Start: 2020-04-21 — End: ?

## 2020-04-21 MED ORDER — DICLOFENAC SODIUM 75 MG PO TBEC
75 mg | Freq: Two times a day (BID) | ORAL | 0 refills | Status: CP
Start: 2020-04-21 — End: ?

## 2020-04-21 MED ORDER — CHLORHEXIDINE GLUCONATE 0.12 % MT SOLN
15 mL | Freq: Two times a day (BID) | OROMUCOSAL | 0 refills | Status: CP
Start: 2020-04-21 — End: ?

## 2020-06-02 IMAGING — US US ABDOMEN LIMITED
1 series · 14 of 25 positions shown · non-contrast
Comparison: None.

CLINICAL DATA: Elevated LFTs

EXAM:
ULTRASOUND ABDOMEN LIMITED RIGHT UPPER QUADRANT

[Series 1: us abdomen limited · 14 of 40 slices shown]
[im 1/40]
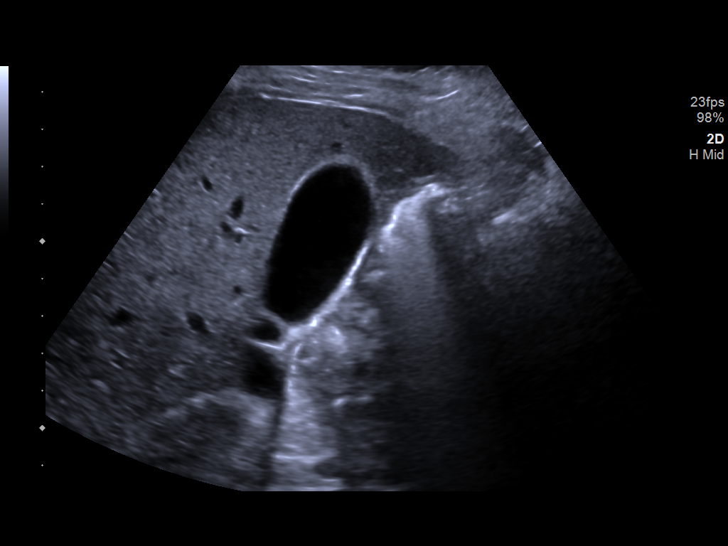
[im 4/40]
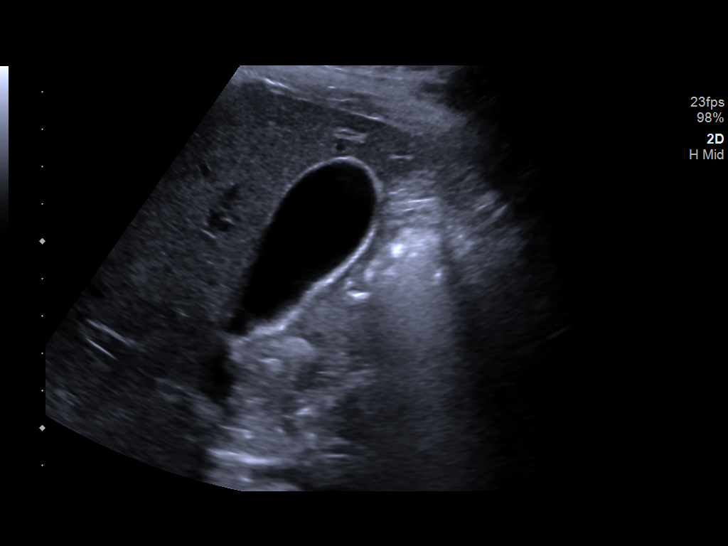
[im 7/40]
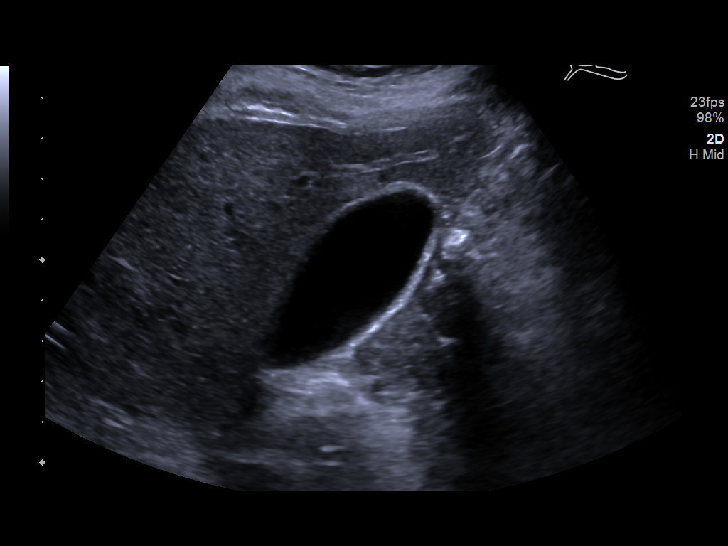
[im 10/40]
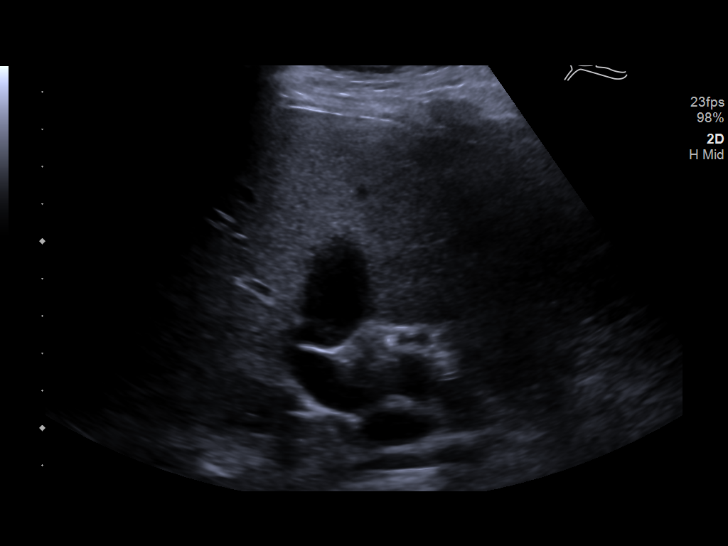
[im 14/40]
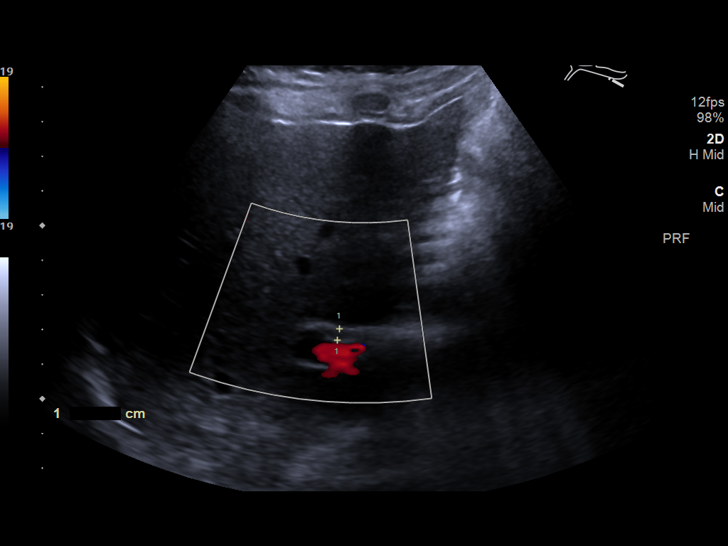
[im 15/40]
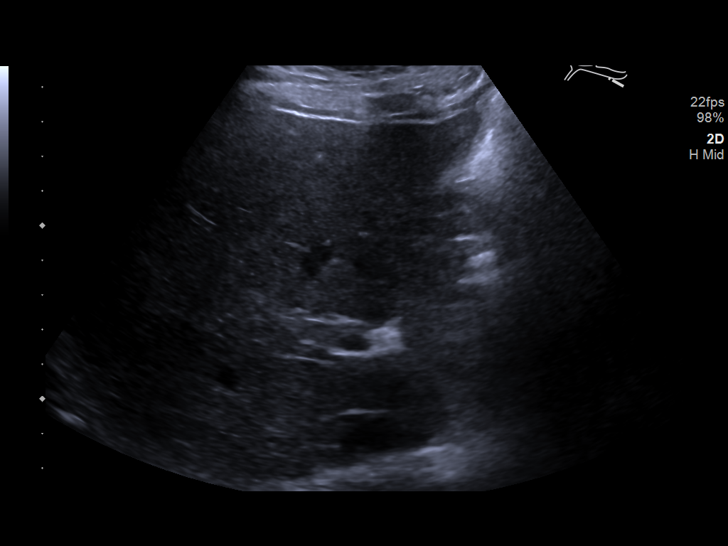
[im 18/40]
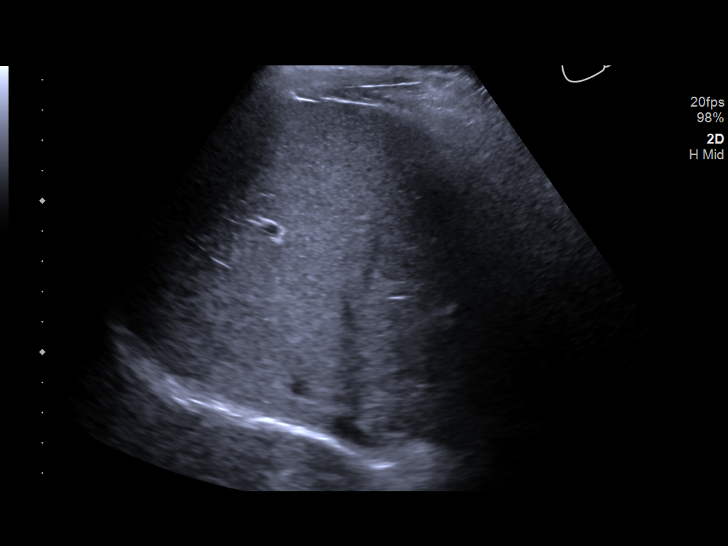
[im 22/40]
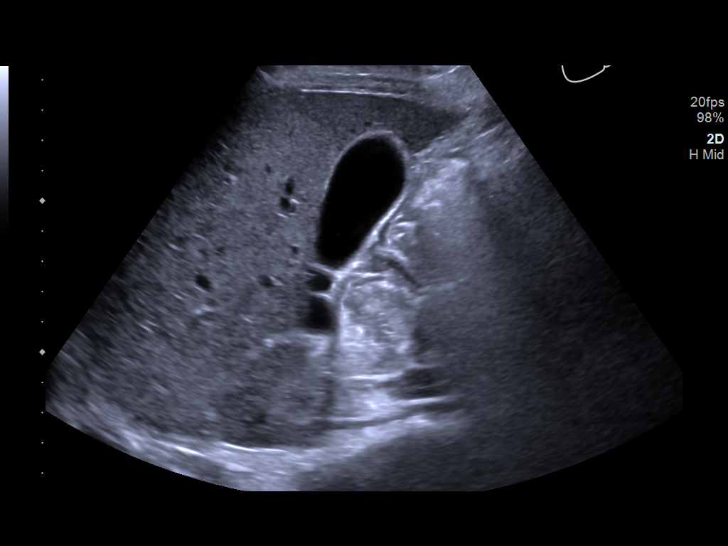
[im 25/40]
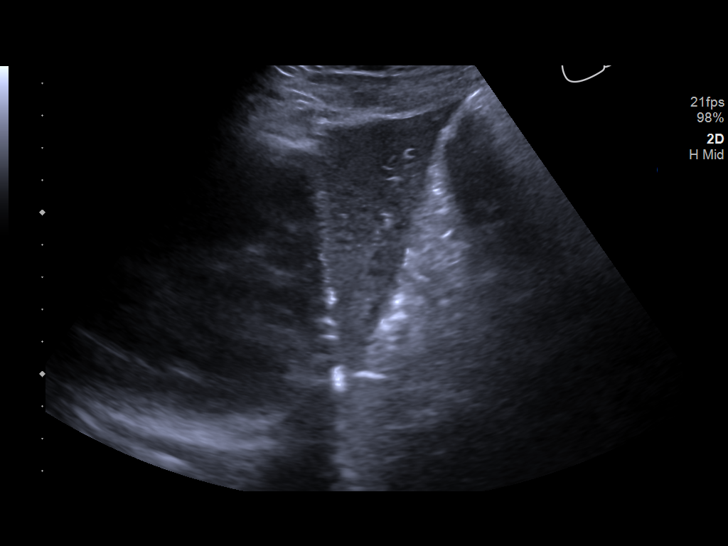
[im 27/40]
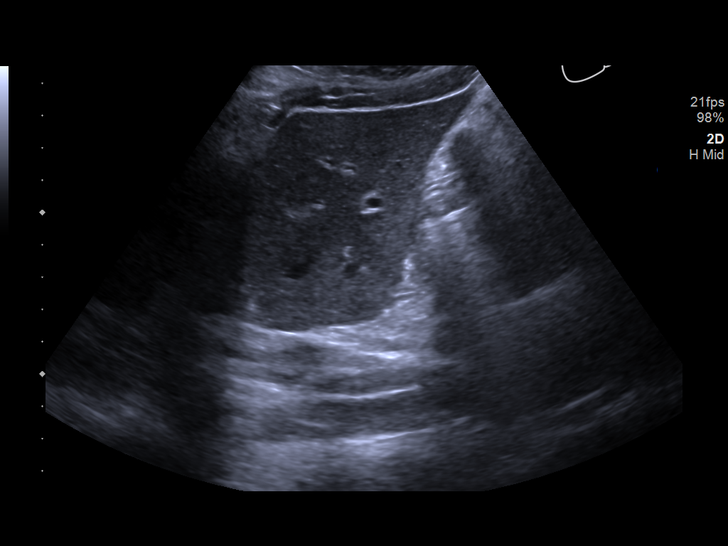
[im 30/40]
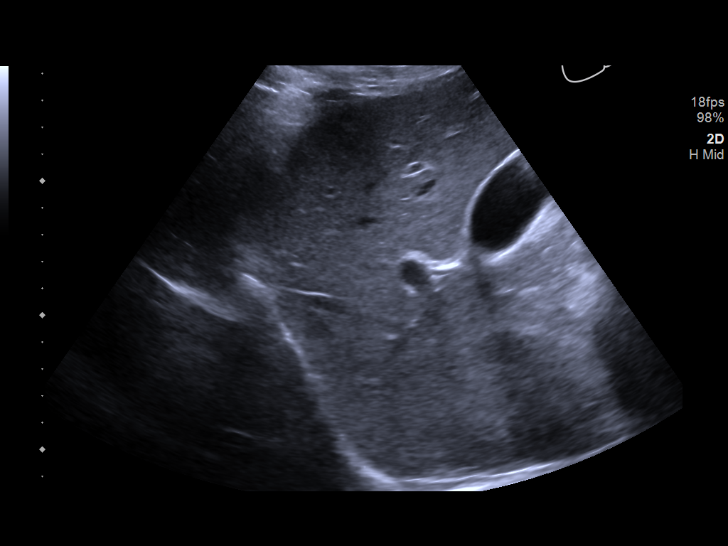
[im 33/40]
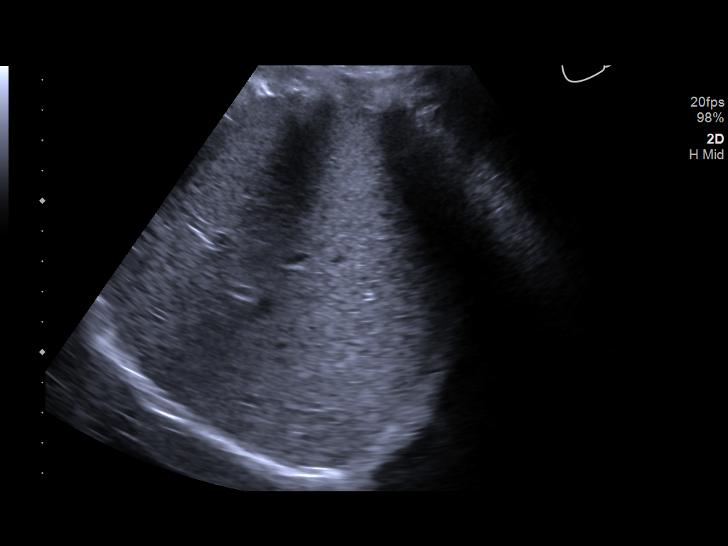
[im 36/40]
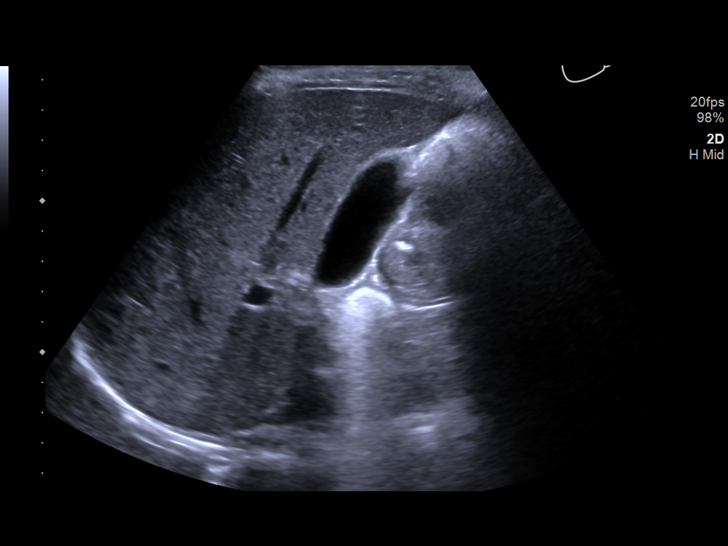
[im 40/40]
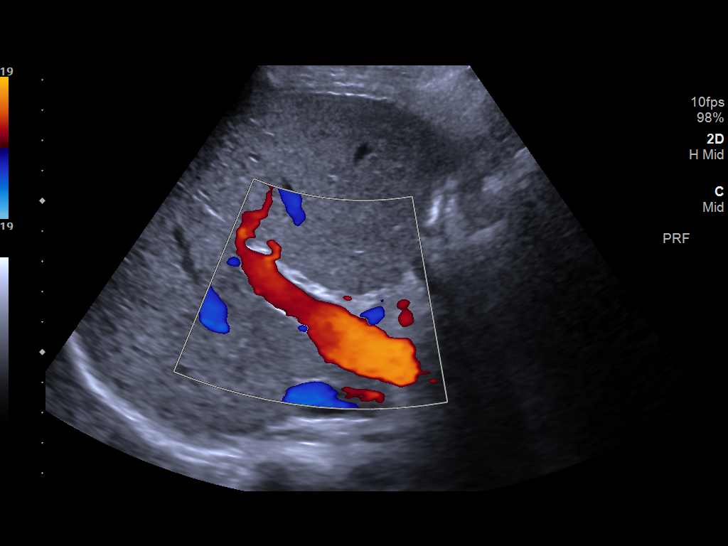

[14 of 25 positions shown; findings below may reference images not displayed]

FINDINGS: Gallbladder:

No gallstones or wall thickening visualized. No sonographic Murphy
sign noted by sonographer.

Common bile duct:

Diameter: 3.3 mm

Liver:

No focal lesion identified. Within normal limits in parenchymal
echogenicity. Portal vein is patent on color Doppler imaging with
normal direction of blood flow towards the liver.
IMPRESSION: No acute abnormality noted.

## 2020-06-18 IMAGING — US US MFM OB LIMITED
1 series · 15 of 28 positions shown · non-contrast
Comparison: none

[Series 1: us mfm ob limited · 15 of 33 slices shown]
[im 1/33]
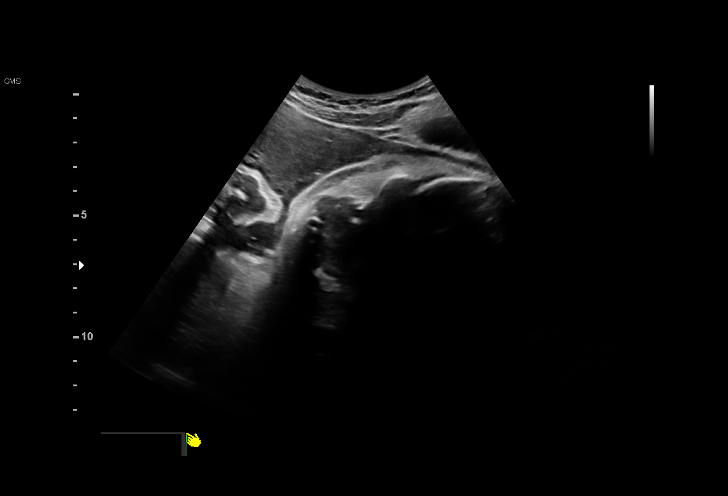
[im 3/33]
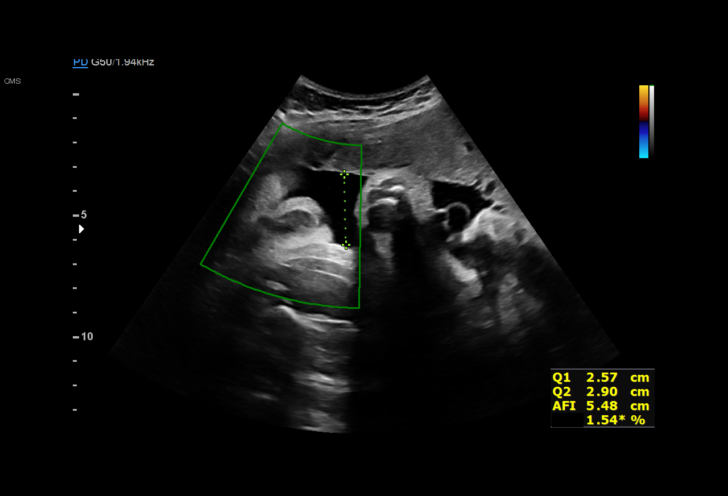
[im 5/33]
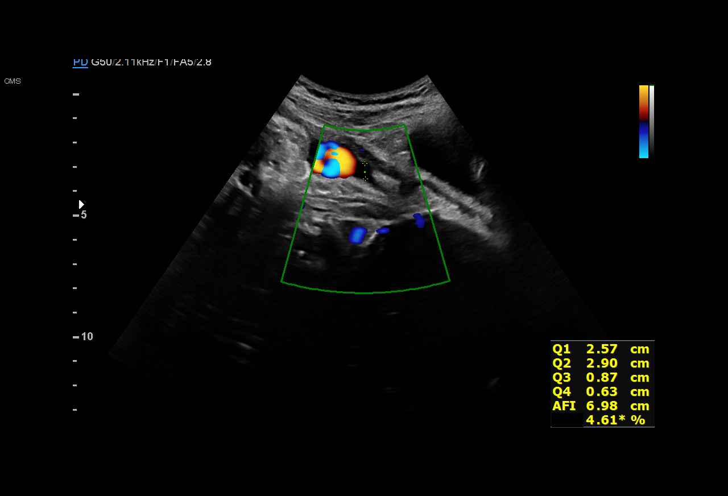
[im 8/33]
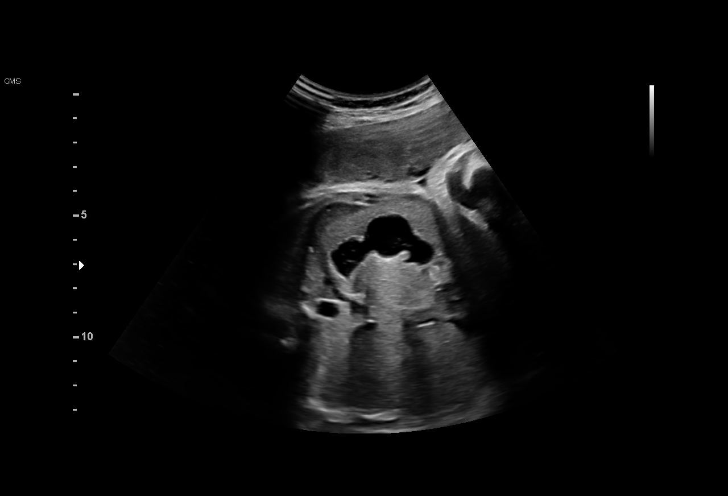
[im 10/33]
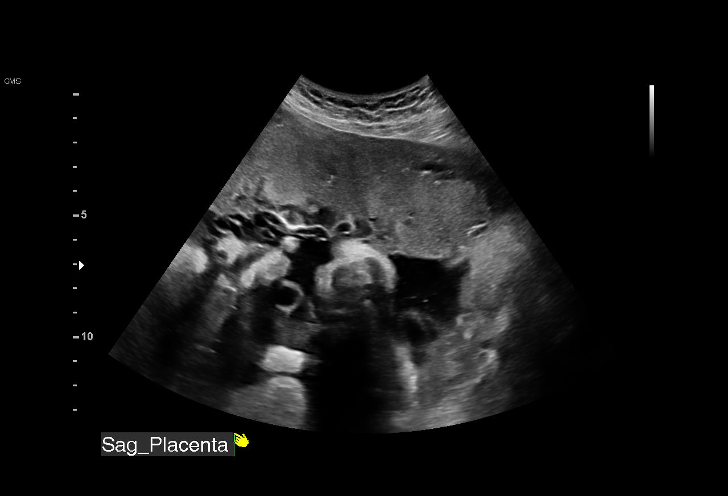
[im 12/33]
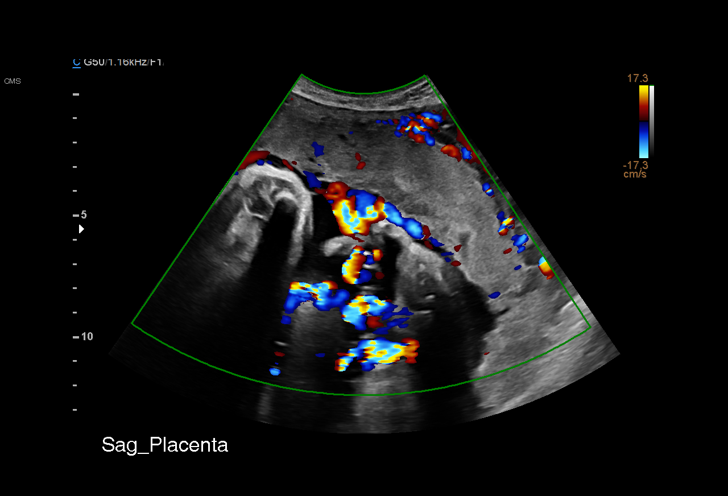
[im 15/33]
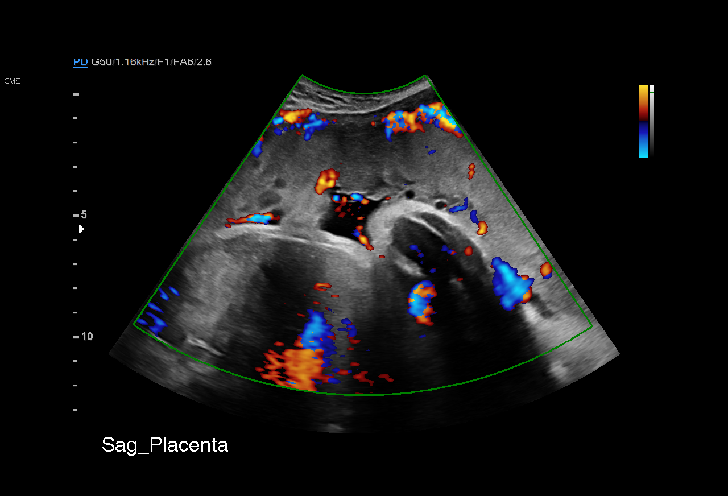
[im 17/33]
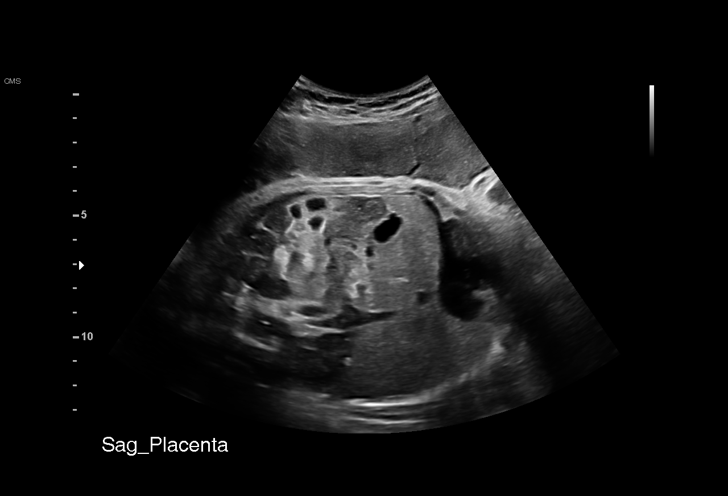
[im 18/33]
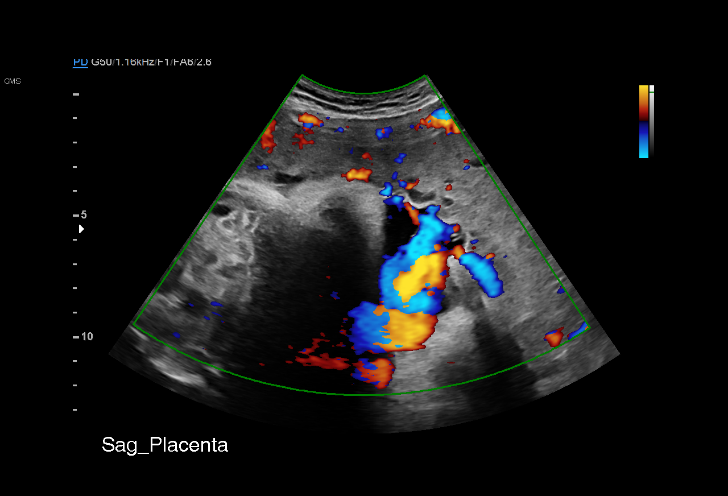
[im 21/33]
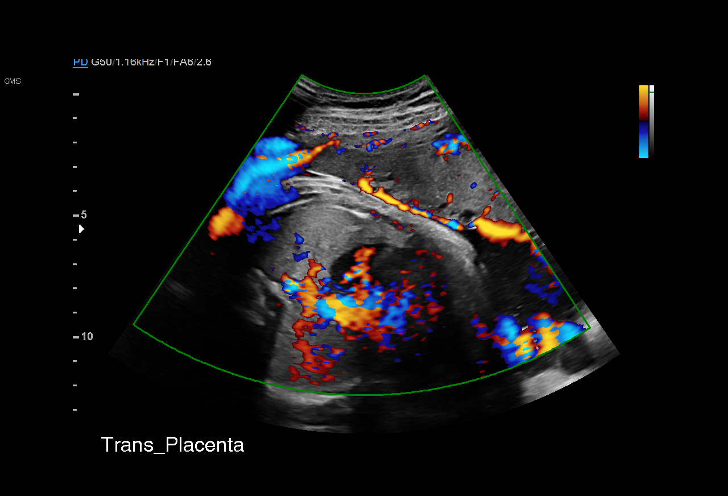
[im 23/33]
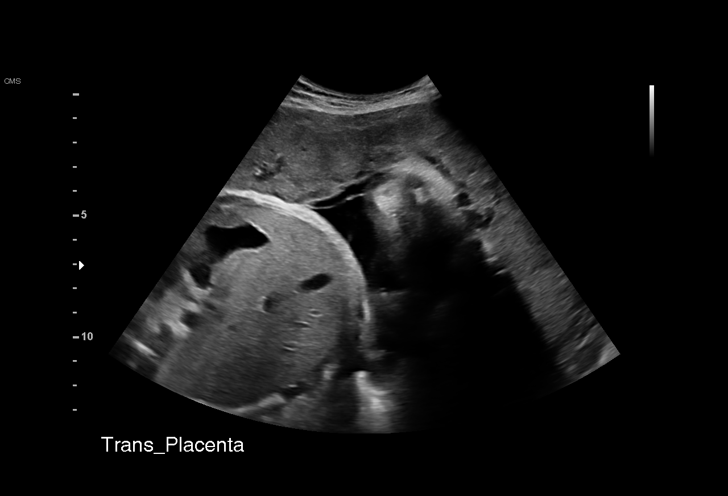
[im 25/33]
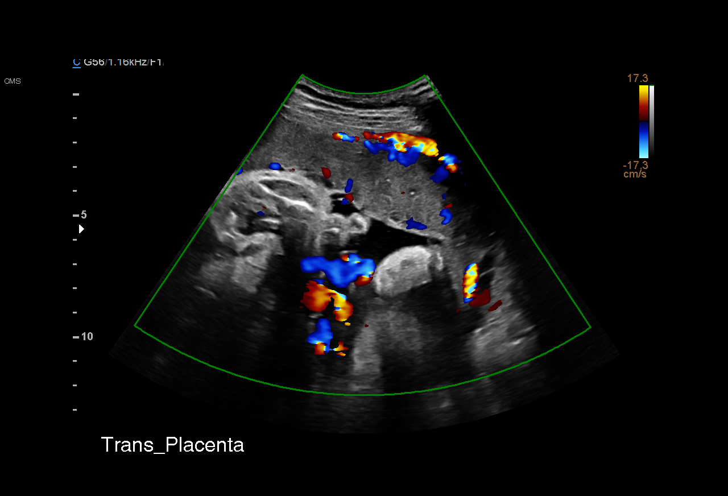
[im 28/33]
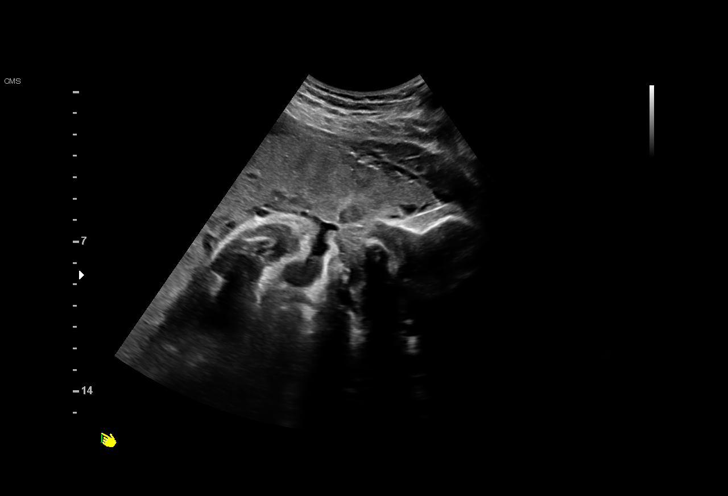
[im 30/33]
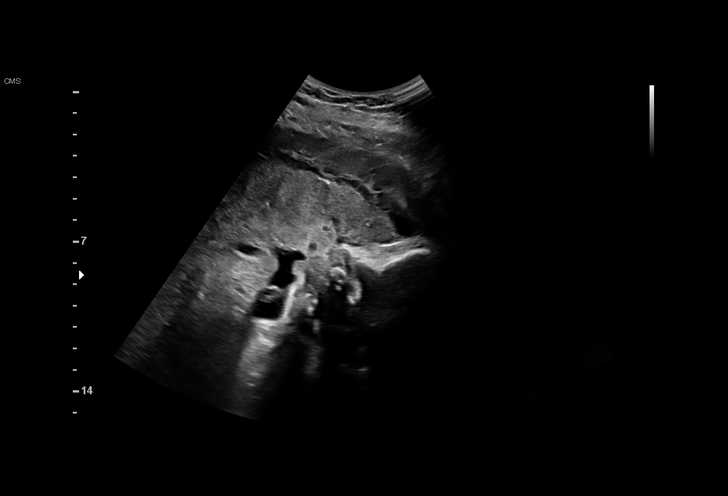
[im 33/33]
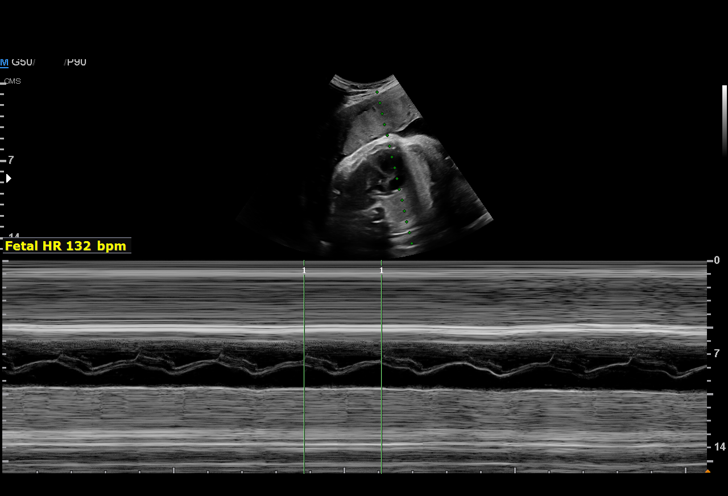

[15 of 28 positions shown; findings below may reference images not displayed]

LUBIER

  1  US MFM OB LIMITED                    76815.01     MRX KARIA
 ----------------------------------------------------------------------

 ----------------------------------------------------------------------
Indications

  Vaginal bleeding in pregnancy, third trimester
  Insufficient Prenatal Care
  38 weeks gestation of pregnancy
 ----------------------------------------------------------------------
Fetal Evaluation

 Num Of Fetuses:          1
 Fetal Heart Rate(bpm):   127
 Cardiac Activity:        Observed
 Presentation:            Cephalic
 Placenta:                Anterior
 P. Cord Insertion:       Visualized

 Amniotic Fluid
 AFI FV:      Subjectively low-normal

 AFI Sum(cm)     %Tile       Largest Pocket(cm)
 6.97            4

 RUQ(cm)       RLQ(cm)       LUQ(cm)        LLQ(cm)

Gestational Age

 LMP:           36w 0d        Date:  04/12/18                 EDD:   01/17/19
 Best:          38w 4d     Det. By:  Early Ultrasound         EDD:   12/30/18
                                     (05/14/18)
Impression

 No evidence of placenta previa or abruption.
 Normal amniotic fluid but subjectively decreased
Recommendations

 Follow up as clinically indicated.

## 2021-01-28 ENCOUNTER — Inpatient Hospital Stay: Admit: 2021-01-28 | Payer: Medicaid Other

## 2021-01-28 ENCOUNTER — Emergency Department: Admit: 2021-01-28 | Payer: Medicaid Other

## 2021-01-28 ENCOUNTER — Ambulatory Visit: Payer: Medicaid Other | Attending: Student in an Organized Health Care Education/Training Program

## 2021-01-28 DIAGNOSIS — H05239 Hemorrhage of unspecified orbit: Secondary | ICD-10-CM

## 2021-01-28 DIAGNOSIS — H539 Unspecified visual disturbance: Secondary | ICD-10-CM

## 2021-01-28 DIAGNOSIS — H5213 Myopia, bilateral: Principal | ICD-10-CM

## 2021-01-28 DIAGNOSIS — Z8669 Personal history of other diseases of the nervous system and sense organs: Secondary | ICD-10-CM

## 2021-01-28 DIAGNOSIS — S0993XA Unspecified injury of face, initial encounter: Secondary | ICD-10-CM

## 2021-01-28 MED ORDER — FENTANYL CITRATE INJ 50 MCG/ML CUSTOM AMP/VIAL
50 ug | Freq: Once | INTRAVENOUS | Status: DC
Start: 2021-01-28 — End: 2021-01-28

## 2021-01-28 MED ORDER — ONDANSETRON HCL 4 MG/2ML IJ SOLN
4 mg | Freq: Once | INTRAVENOUS | Status: CP
Start: 2021-01-28 — End: ?

## 2021-01-28 MED ORDER — NAPROXEN 500 MG PO TABS
500 mg | Freq: Two times a day (BID) | ORAL | 0 refills | Status: CP
Start: 2021-01-28 — End: ?

## 2021-01-28 MED ORDER — KETOROLAC TROMETHAMINE 15 MG/ML IJ SOLN
15 mg | Freq: Once | INTRAVENOUS | Status: CP
Start: 2021-01-28 — End: ?

## 2021-02-04 ENCOUNTER — Ambulatory Visit: Payer: Medicaid Other | Attending: Student in an Organized Health Care Education/Training Program

## 2021-02-04 DIAGNOSIS — S0993XD Unspecified injury of face, subsequent encounter: Secondary | ICD-10-CM

## 2021-02-04 DIAGNOSIS — H5213 Myopia, bilateral: Principal | ICD-10-CM

## 2021-02-04 DIAGNOSIS — H539 Unspecified visual disturbance: Secondary | ICD-10-CM

## 2021-02-05 ENCOUNTER — Inpatient Hospital Stay: Admit: 2021-02-05 | Discharge: 2021-02-05 | Payer: Medicaid Other

## 2021-02-05 DIAGNOSIS — R509 Fever, unspecified: Secondary | ICD-10-CM

## 2021-02-05 DIAGNOSIS — R0981 Nasal congestion: Secondary | ICD-10-CM

## 2021-02-05 DIAGNOSIS — J039 Acute tonsillitis, unspecified: Principal | ICD-10-CM

## 2021-02-05 MED ORDER — LIDOCAINE VISCOUS 2 % MT SOLN 15 ML UD
5 mL | Freq: Once | OROMUCOSAL | Status: CP
Start: 2021-02-05 — End: ?

## 2021-02-05 MED ORDER — LIDOCAINE VISCOUS HCL 2 % MT SOLN
5 mL | ORAL | 0 refills | Status: CP | PRN
Start: 2021-02-05 — End: ?

## 2021-02-05 MED ORDER — DEXAMETHASONE SODIUM PHOSPHATE 4 MG/ML CUSTOM COMPONENT JX
10 mg | Freq: Once | ORAL | Status: CP
Start: 2021-02-05 — End: ?

## 2021-02-05 MED ORDER — AMOXICILLIN 500 MG PO CAPS
500 mg | Freq: Two times a day (BID) | ORAL | 0 refills | Status: CP
Start: 2021-02-05 — End: ?

## 2021-02-05 MED ORDER — AMOXICILLIN 500 MG PO CAPS
500 mg | Freq: Once | ORAL | Status: CP
Start: 2021-02-05 — End: ?

## 2021-10-27 ENCOUNTER — Inpatient Hospital Stay: Admit: 2021-10-27 | Discharge: 2021-10-27 | Payer: Medicaid Other

## 2021-10-27 ENCOUNTER — Emergency Department: Admit: 2021-10-27 | Discharge: 2021-10-27 | Payer: Medicaid Other

## 2021-10-27 DIAGNOSIS — T07XXXA Unspecified multiple injuries, initial encounter: Secondary | ICD-10-CM

## 2021-10-27 DIAGNOSIS — S99912A Unspecified injury of left ankle, initial encounter: Principal | ICD-10-CM

## 2021-10-27 DIAGNOSIS — S99922A Unspecified injury of left foot, initial encounter: Secondary | ICD-10-CM

## 2021-10-27 MED ORDER — TETANUS-DIPHTH-ACELL PERTUSSIS 5-2.5-18.5 LF-MCG/0.5 IM SUSY
0.5 mL | Freq: Once | INTRAMUSCULAR | Status: CP
Start: 2021-10-27 — End: ?

## 2021-10-27 MED ORDER — IBUPROFEN 400 MG PO TABS
800 mg | Freq: Once | ORAL | Status: CP | PRN
Start: 2021-10-27 — End: ?

## 2021-11-09 ENCOUNTER — Inpatient Hospital Stay: Admit: 2021-11-09 | Discharge: 2021-11-10

## 2021-11-10 ENCOUNTER — Emergency Department: Admit: 2021-11-10 | Discharge: 2021-11-10

## 2021-11-10 ENCOUNTER — Inpatient Hospital Stay: Admit: 2021-11-10 | Discharge: 2021-11-10

## 2021-11-10 DIAGNOSIS — A599 Trichomoniasis, unspecified: Principal | ICD-10-CM

## 2021-11-10 DIAGNOSIS — N939 Abnormal uterine and vaginal bleeding, unspecified: Secondary | ICD-10-CM

## 2021-11-10 DIAGNOSIS — N898 Other specified noninflammatory disorders of vagina: Secondary | ICD-10-CM

## 2021-11-10 DIAGNOSIS — Z3490 Encounter for supervision of normal pregnancy, unspecified, unspecified trimester: Secondary | ICD-10-CM

## 2021-11-10 MED ORDER — METRONIDAZOLE 500 MG PO TABS
500 mg | Freq: Two times a day (BID) | ORAL | 0 refills | Status: CP
Start: 2021-11-10 — End: ?

## 2021-11-10 MED ORDER — CEPHALEXIN 500 MG PO CAPS
500 mg | Freq: Two times a day (BID) | ORAL | 0 refills | Status: CP
Start: 2021-11-10 — End: ?

## 2021-11-10 MED ORDER — BOLUS IV FLUID JX
Freq: Once | INTRAVENOUS | Status: CP
Start: 2021-11-10 — End: ?

## 2021-11-12 MED ORDER — AZITHROMYCIN 500 MG PO TABS
ORAL | 0 refills | 3.00000 days | Status: CP
Start: 2021-11-12 — End: ?

## 2022-01-19 ENCOUNTER — Encounter: Primary: Family Medicine

## 2022-02-09 ENCOUNTER — Inpatient Hospital Stay: Admit: 2022-02-09 | Discharge: 2022-02-10

## 2022-02-09 DIAGNOSIS — N949 Unspecified condition associated with female genital organs and menstrual cycle: Principal | ICD-10-CM

## 2022-02-09 MED ORDER — CYCLOBENZAPRINE HCL 10 MG PO TABS
10 mg | Freq: Three times a day (TID) | ORAL | 0 refills | Status: CP
Start: 2022-02-09 — End: ?

## 2023-06-07 ENCOUNTER — Encounter: Primary: Family Medicine
